# Patient Record
Sex: Male | Born: 1945 | Race: White | Hispanic: No | Marital: Married | Smoking: Former smoker
Health system: Southern US, Community
[De-identification: ages and names within clinical notes are randomized; demographics above are authoritative.]

## PROBLEM LIST (undated history)

## (undated) ENCOUNTER — Emergency Department (HOSPITAL_COMMUNITY): Admission: EM | Discharge status: Absent without leave | Payer: Medicare Other | Source: Home / Self Care

## (undated) DIAGNOSIS — Z87442 Personal history of urinary calculi: Secondary | ICD-10-CM

## (undated) DIAGNOSIS — E876 Hypokalemia: Secondary | ICD-10-CM

## (undated) DIAGNOSIS — G629 Polyneuropathy, unspecified: Secondary | ICD-10-CM

## (undated) DIAGNOSIS — K219 Gastro-esophageal reflux disease without esophagitis: Secondary | ICD-10-CM

## (undated) DIAGNOSIS — E039 Hypothyroidism, unspecified: Secondary | ICD-10-CM

## (undated) DIAGNOSIS — I10 Essential (primary) hypertension: Secondary | ICD-10-CM

## (undated) DIAGNOSIS — I209 Angina pectoris, unspecified: Secondary | ICD-10-CM

## (undated) DIAGNOSIS — R202 Paresthesia of skin: Secondary | ICD-10-CM

## (undated) DIAGNOSIS — I00 Rheumatic fever without heart involvement: Secondary | ICD-10-CM

## (undated) DIAGNOSIS — R7303 Prediabetes: Secondary | ICD-10-CM

## (undated) DIAGNOSIS — N179 Acute kidney failure, unspecified: Secondary | ICD-10-CM

## (undated) DIAGNOSIS — I519 Heart disease, unspecified: Secondary | ICD-10-CM

## (undated) DIAGNOSIS — E785 Hyperlipidemia, unspecified: Secondary | ICD-10-CM

## (undated) DIAGNOSIS — C801 Malignant (primary) neoplasm, unspecified: Secondary | ICD-10-CM

## (undated) DIAGNOSIS — I5189 Other ill-defined heart diseases: Secondary | ICD-10-CM

## (undated) DIAGNOSIS — R0600 Dyspnea, unspecified: Secondary | ICD-10-CM

## (undated) HISTORY — PX: COLONOSCOPY: SHX174

## (undated) HISTORY — PX: PAROTIDECTOMY: SUR1003

## (undated) HISTORY — PX: EYE SURGERY: SHX253

## (undated) HISTORY — PX: THYROIDECTOMY, PARTIAL: SHX18

## (undated) HISTORY — PX: TONSILLECTOMY: SUR1361

## (undated) HISTORY — PX: SPHINCTEROTOMY: SHX5279

## (undated) HISTORY — PX: NASAL POLYP EXCISION: SHX2068

---

## 2001-02-08 ENCOUNTER — Encounter: Admission: RE | Admit: 2001-02-08 | Discharge: 2001-05-09 | Payer: Self-pay | Admitting: *Deleted

## 2005-06-15 ENCOUNTER — Ambulatory Visit: Payer: Self-pay | Admitting: Internal Medicine

## 2005-08-31 ENCOUNTER — Ambulatory Visit: Payer: Self-pay | Admitting: Gastroenterology

## 2005-09-21 ENCOUNTER — Ambulatory Visit: Payer: Self-pay | Admitting: Gastroenterology

## 2006-02-02 ENCOUNTER — Ambulatory Visit: Payer: Self-pay | Admitting: Internal Medicine

## 2011-01-24 ENCOUNTER — Ambulatory Visit: Payer: Self-pay | Admitting: Gastroenterology

## 2011-01-26 LAB — PATHOLOGY REPORT

## 2018-04-12 ENCOUNTER — Other Ambulatory Visit: Payer: Self-pay | Admitting: Nephrology

## 2018-04-12 DIAGNOSIS — E876 Hypokalemia: Secondary | ICD-10-CM

## 2018-04-19 ENCOUNTER — Ambulatory Visit: Payer: Self-pay

## 2018-04-24 ENCOUNTER — Ambulatory Visit
Admission: RE | Admit: 2018-04-24 | Discharge: 2018-04-24 | Disposition: A | Discharge status: Home / Self Care | Payer: Medicare Other | Source: Ambulatory Visit | Attending: Nephrology | Admitting: Nephrology

## 2018-04-24 DIAGNOSIS — N400 Enlarged prostate without lower urinary tract symptoms: Secondary | ICD-10-CM | POA: Insufficient documentation

## 2018-04-24 DIAGNOSIS — N200 Calculus of kidney: Secondary | ICD-10-CM | POA: Insufficient documentation

## 2018-04-24 DIAGNOSIS — E876 Hypokalemia: Secondary | ICD-10-CM | POA: Insufficient documentation

## 2018-06-12 ENCOUNTER — Other Ambulatory Visit: Payer: Self-pay

## 2018-06-12 ENCOUNTER — Encounter (HOSPITAL_COMMUNITY): Payer: Self-pay | Admitting: Emergency Medicine

## 2018-06-12 NOTE — ED Triage Notes (Deleted)
Pt arriving via [facility] from home for intermittent lower back pain that began monday. 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

## 2018-06-12 NOTE — ED Notes (Signed)
Bed: WTR7 Expected date:  Expected time:  Means of arrival:  Comments: 

## 2018-08-21 ENCOUNTER — Ambulatory Visit
Admission: RE | Admit: 2018-08-21 | Discharge: 2018-08-21 | Disposition: A | Discharge status: Home / Self Care | Payer: Medicare Other | Attending: Nephrology | Admitting: Nephrology

## 2018-08-21 ENCOUNTER — Other Ambulatory Visit: Payer: Self-pay | Admitting: Nephrology

## 2018-08-21 ENCOUNTER — Ambulatory Visit
Admission: RE | Admit: 2018-08-21 | Discharge: 2018-08-21 | Disposition: A | Discharge status: Home / Self Care | Payer: Medicare Other | Source: Ambulatory Visit | Attending: Nephrology | Admitting: Nephrology

## 2018-08-21 DIAGNOSIS — N200 Calculus of kidney: Secondary | ICD-10-CM

## 2018-09-17 ENCOUNTER — Other Ambulatory Visit: Payer: Self-pay

## 2018-09-17 ENCOUNTER — Other Ambulatory Visit: Payer: Medicare Other

## 2018-09-17 ENCOUNTER — Encounter
Admission: RE | Admit: 2018-09-17 | Discharge: 2018-09-17 | Disposition: A | Discharge status: Home / Self Care | Payer: Medicare Other | Source: Ambulatory Visit | Attending: Otolaryngology | Admitting: Otolaryngology

## 2018-09-17 HISTORY — DX: Malignant (primary) neoplasm, unspecified: C80.1

## 2018-09-17 HISTORY — DX: Hypothyroidism, unspecified: E03.9

## 2018-09-17 HISTORY — DX: Dyspnea, unspecified: R06.00

## 2018-09-17 HISTORY — DX: Essential (primary) hypertension: I10

## 2018-09-17 HISTORY — DX: Hypokalemia: E87.6

## 2018-09-17 HISTORY — DX: Angina pectoris, unspecified: I20.9

## 2018-09-17 HISTORY — DX: Polyneuropathy, unspecified: G62.9

## 2018-09-17 HISTORY — DX: Gastro-esophageal reflux disease without esophagitis: K21.9

## 2018-09-17 NOTE — Patient Instructions (Signed)
Your procedure is scheduled on:09/19/18 Report to Day Surgery. [facility] To find out your arrival time please call [phone] between 1PM - 3PM on 09/18/18  Remember: Instructions that are not followed completely may result in serious medical risk,  up to and including death, or upon the discretion of your surgeon and anesthesiologist your  surgery may need to be rescheduled.     _X__ 1. Do not eat food after midnight the night before your procedure.                 No gum chewing or hard candies. You may drink clear liquids up to 2 hours                 before you are scheduled to arrive for your surgery- DO not drink clear                 liquids within 2 hours of the start of your surgery.                 Clear Liquids include:  water, apple juice without pulp, clear carbohydrate                 drink such as Clearfast of Gatorade, Black Coffee or Tea (Do not add                 anything to coffee or tea).  __X__2.  On the morning of surgery brush your teeth with toothpaste and water, you                may rinse your mouth with mouthwash if you wish.  Do not swallow any toothpaste of mouthwash.     _X__ 3.  No Alcohol for 24 hours before or after surgery.   _X__ 4.  Do Not Smoke or use e-cigarettes For 24 Hours Prior to Your Surgery.                 Do not use any chewable tobacco products for at least 6 hours prior to                 surgery.  ____  5.  Bring all medications with you on the day of surgery if instructed.   _X___  6.  Notify your doctor if there is any change in your medical condition      (cold, fever, infections).     Do not wear jewelry, make-up, hairpins, clips or nail polish. Do not wear lotions, powders, or perfumes. You may wear deodorant. Do not shave 48 hours prior to surgery. Men may shave face and neck. Do not bring valuables to the hospital.    [facility] is not responsible for any belongings or  valuables.  Contacts, dentures or bridgework may not be worn into surgery. Leave your suitcase in the car. After surgery it may be brought to your room. For patients admitted to the hospital, discharge time is determined by your treatment team.   Patients discharged the day of surgery will not be allowed to drive home.          __X__ Take these medicines the morning of surgery with A SIP OF WATER:    1. AMLODIPINE  2. LEVOTHYROXINE  3.   4.  5.  6.  ____ Fleet Enema (as directed)   ____ Use CHG Soap as directed  ____ Use inhalers on the day of surgery  ____ Stop metformin 2  days prior to surgery    ____ Take 1/2 of usual insulin dose the night before surgery. No insulin the morning          of surgery.   X___ Stop Coumadin/Plavix/aspirin on   STOPPED  _X__ Stop Anti-inflammatories on   STOPPED   __X__ Stop supplements until after surgery.   NO FISH OIL UNTIL AFTER SURGERY  ____ Bring C-Pap to the hospital.

## 2018-09-19 ENCOUNTER — Other Ambulatory Visit: Payer: Self-pay

## 2018-09-19 ENCOUNTER — Ambulatory Visit: Payer: Medicare Other | Admitting: Anesthesiology

## 2018-09-19 ENCOUNTER — Encounter: Payer: Self-pay | Admitting: *Deleted

## 2018-09-19 ENCOUNTER — Encounter
Admission: RE | Disposition: A | Discharge status: Home / Self Care | Payer: Self-pay | Source: Home / Self Care | Attending: Otolaryngology

## 2018-09-19 ENCOUNTER — Ambulatory Visit
Admission: RE | Admit: 2018-09-19 | Discharge: 2018-09-19 | Disposition: A | Discharge status: Home / Self Care | Payer: Medicare Other | Attending: Otolaryngology | Admitting: Otolaryngology

## 2018-09-19 DIAGNOSIS — I10 Essential (primary) hypertension: Secondary | ICD-10-CM | POA: Diagnosis not present

## 2018-09-19 DIAGNOSIS — E039 Hypothyroidism, unspecified: Secondary | ICD-10-CM | POA: Diagnosis not present

## 2018-09-19 DIAGNOSIS — H6523 Chronic serous otitis media, bilateral: Secondary | ICD-10-CM | POA: Insufficient documentation

## 2018-09-19 DIAGNOSIS — H6980 Other specified disorders of Eustachian tube, unspecified ear: Secondary | ICD-10-CM | POA: Insufficient documentation

## 2018-09-19 DIAGNOSIS — E876 Hypokalemia: Secondary | ICD-10-CM | POA: Insufficient documentation

## 2018-09-19 DIAGNOSIS — G629 Polyneuropathy, unspecified: Secondary | ICD-10-CM | POA: Diagnosis not present

## 2018-09-19 DIAGNOSIS — K219 Gastro-esophageal reflux disease without esophagitis: Secondary | ICD-10-CM | POA: Insufficient documentation

## 2018-09-19 HISTORY — PX: MYRINGOTOMY WITH TUBE PLACEMENT: SHX5663

## 2018-09-19 SURGERY — MYRINGOTOMY WITH TUBE PLACEMENT
Anesthesia: General | Site: Ear | Laterality: Bilateral

## 2018-09-19 MED ORDER — FAMOTIDINE 20 MG PO TABS
ORAL_TABLET | ORAL | Status: AC
  Filled 2018-09-19: qty 1

## 2018-09-19 MED ORDER — ONDANSETRON HCL 4 MG PO TABS: 4.0000 mg | ORAL_TABLET | Freq: Three times a day (TID) | ORAL | 0 refills | Status: DC | PRN

## 2018-09-19 MED ORDER — FENTANYL CITRATE (PF) 100 MCG/2ML IJ SOLN
INTRAMUSCULAR | Status: AC
  Filled 2018-09-19: qty 2

## 2018-09-19 MED ORDER — LACTATED RINGERS IV SOLN
INTRAVENOUS | Status: DC
  Administered 2018-09-19: 07:00:00 via INTRAVENOUS

## 2018-09-19 MED ORDER — MIDAZOLAM HCL 2 MG/2ML IJ SOLN
INTRAMUSCULAR | Status: AC
  Filled 2018-09-19: qty 2

## 2018-09-19 MED ORDER — FAMOTIDINE 20 MG PO TABS
20.0000 mg | ORAL_TABLET | Freq: Once | ORAL | Status: AC
  Administered 2018-09-19: 20 mg via ORAL

## 2018-09-19 SURGICAL SUPPLY — 10 items
BLADE MYR LANCE NRW W/HDL (BLADE) ×3 IMPLANT
CANISTER SUCT 1200ML W/VALVE (MISCELLANEOUS) ×3 IMPLANT
COTTON BALL STRL MEDIUM (GAUZE/BANDAGES/DRESSINGS) ×3 IMPLANT
COVER WAND RF STERILE (DRAPES) ×3 IMPLANT
GLOVE BIOGEL M STRL SZ7.5 (GLOVE) ×3 IMPLANT
TOWEL OR 17X26 4PK STRL BLUE (TOWEL DISPOSABLE) ×3 IMPLANT
TUBE EAR ARMSTRONG HC 1.14X3.5 (OTOLOGIC RELATED) IMPLANT
TUBE EAR T 1.27X5.3 BFLY (OTOLOGIC RELATED) ×6 IMPLANT
TUBING CONNECTING 10 (TUBING) ×2 IMPLANT
TUBING CONNECTING 10' (TUBING) ×1

## 2018-09-19 NOTE — Anesthesia Preprocedure Evaluation (Signed)
Anesthesia Evaluation  Patient identified by MRN, date of birth, ID band Patient awake    Reviewed: Allergy & Precautions, NPO status , Patient's Chart, lab work & pertinent test results  History of Anesthesia Complications Negative for: history of anesthetic complications  Airway Mallampati: II  TM Distance: >3 FB Neck ROM: Full    Dental no notable dental hx.    Pulmonary neg sleep apnea, neg COPD, former smoker,    breath sounds clear to auscultation- rhonchi (-) wheezing      Cardiovascular hypertension, Pt. on medications (-) CAD, (-) Past MI, (-) Cardiac Stents and (-) CABG  Rhythm:Regular Rate:Normal - Systolic murmurs and - Diastolic murmurs NM stress test 07/25/18: Normal myocardial perfusion scan no evidence of stress-induced  myocardial-ischemia ejection fraction of 52% conclusion negative scan   Neuro/Psych neg Seizures negative neurological ROS  negative psych ROS   GI/Hepatic Neg liver ROS, GERD  ,  Endo/Other  neg diabetesHypothyroidism   Renal/GU negative Renal ROS     Musculoskeletal negative musculoskeletal ROS (+)   Abdominal (+) - obese,   Peds  Hematology negative hematology ROS (+)   Anesthesia Other Findings Past Medical History: No date: Anginal pain ([place])     Comment:  AT REST. STRESS/ECHO 07/25/18 No date: [facility])     Comment:  SKIN No date: Chronic hypokalemia     Comment:  SEEING DR [facility] No date: Dyspnea     Comment:  DOE No date: GERD (gastroesophageal reflux disease) No date: Hypertension No date: Hypothyroidism No date: Neuropathy     Comment:  FEET   Reproductive/Obstetrics                             Anesthesia Physical Anesthesia Plan  ASA: III  Anesthesia Plan: General   Post-op Pain Management:    Induction: Intravenous  PONV Risk Score and Plan: 1 and Ondansetron and Midazolam  Airway Management Planned: LMA  Additional  Equipment:   Intra-op Plan:   Post-operative Plan:   Informed Consent: I have reviewed the patients History and Physical, chart, labs and discussed the procedure including the risks, benefits and alternatives for the proposed anesthesia with the patient or authorized representative who has indicated his/her understanding and acceptance.     Dental advisory given  Plan Discussed with: CRNA and Anesthesiologist  Anesthesia Plan Comments:         Anesthesia Quick Evaluation

## 2018-09-19 NOTE — Anesthesia Postprocedure Evaluation (Signed)
Anesthesia Post Note  Patient: Daden Mahany Mcgrady  Procedure(s) Performed: MYRINGOTOMY WITH TUBE PLACEMENT (Bilateral Ear)  Patient location during evaluation: PACU Anesthesia Type: General Level of consciousness: awake and alert and oriented Pain management: pain level controlled Vital Signs Assessment: post-procedure vital signs reviewed and stable Respiratory status: spontaneous breathing, nonlabored ventilation and respiratory function stable Cardiovascular status: blood pressure returned to baseline and stable Postop Assessment: no signs of nausea or vomiting Anesthetic complications: no     Last Vitals:  Vitals:   09/19/18 0716 09/19/18 1017  BP: 125/80 129/69  Pulse: 73 60  Resp: 16 16  Temp: 36.5 C 36.7 C  SpO2: 99% 99%    Last Pain:  Vitals:   09/19/18 1017  TempSrc: Oral  PainSc: 0-No pain                 Ravon Mcilhenny

## 2018-09-19 NOTE — Op Note (Signed)
..  09/19/2018  9:03 AM    Fabrizio, Quillian Quince  [id number]   Pre-Op Dx:  Chronic Otitis Media Eustachian Tube Dysfunction  Post-op Dx: Chronic Otitis Media Eustachian Tube Dysfunction  Proc:Bilateral myringotomy with tubes  Surg: Tyler Young  Anes:  General by mask  EBL:  None  Comp:  None  Findings:  Bilateral serous otitis media.  Butterfly tubes placed posterior inferiorly  Procedure: With the patient in a comfortable supine position, general mask anesthesia was administered.  At an appropriate level, microscope and speculum were used to examine and clean the RIGHT ear canal.  The findings were as described above.  An posterior inferior radial myringotomy incision was sharply executed.  Middle ear contents were suctioned clear with a size 5 otologic suction.  A PE tube was placed without difficulty using a Rosen pick and Animal nutritionist.  Ciprodex otic solution was instilled into the external canal, and insufflated into the middle ear.  A cotton ball was placed at the external meatus. Hemostasis was observed.  This side was completed.  After completing the RIGHT side, the LEFT side was done in identical fashion.    Following this  The patient was returned to anesthesia, awakened, and transferred to recovery in stable condition.  Dispo:  PACU to home  Plan: Routine drop use and water precautions.  Recheck my office three weeks.   Jeannie Fend Kym Scannell 9:03 AM 09/19/2018

## 2018-09-19 NOTE — H&P (Signed)
..  History and Physical paper copy reviewed and updated date of procedure and will be scanned into system.  Patient seen and examined.  

## 2018-09-19 NOTE — Transfer of Care (Signed)
Immediate Anesthesia Transfer of Care Note  Patient: Tyler Young  Procedure(s) Performed: MYRINGOTOMY WITH TUBE PLACEMENT (Bilateral Ear)  Patient Location: PACU  Anesthesia Type:General  Level of Consciousness: awake and alert   Airway & Oxygen Therapy: Patient Spontanous Breathing and Patient connected to face mask oxygen  Post-op Assessment: Report given to RN and Post -op Vital signs reviewed and stable  Post vital signs: Reviewed and stable  Last Vitals:  Vitals Value Taken Time  BP    Temp    Pulse    Resp    SpO2      Last Pain:  Vitals:   09/19/18 1017  TempSrc: Oral  PainSc: 0-No pain         Complications: No apparent anesthesia complications

## 2018-09-20 ENCOUNTER — Encounter: Payer: Self-pay | Admitting: Otolaryngology

## 2019-06-03 ENCOUNTER — Other Ambulatory Visit: Payer: Self-pay | Admitting: Internal Medicine

## 2019-06-03 DIAGNOSIS — N200 Calculus of kidney: Secondary | ICD-10-CM

## 2019-06-13 ENCOUNTER — Other Ambulatory Visit: Payer: Self-pay

## 2019-06-13 ENCOUNTER — Ambulatory Visit
Admission: RE | Admit: 2019-06-13 | Discharge: 2019-06-13 | Disposition: A | Discharge status: Home / Self Care | Payer: Medicare Other | Source: Ambulatory Visit | Attending: Internal Medicine | Admitting: Internal Medicine

## 2019-06-13 DIAGNOSIS — N200 Calculus of kidney: Secondary | ICD-10-CM | POA: Diagnosis present

## 2019-12-24 ENCOUNTER — Other Ambulatory Visit: Payer: Self-pay

## 2019-12-24 ENCOUNTER — Ambulatory Visit: Payer: Medicare Other | Admitting: Urology

## 2019-12-24 ENCOUNTER — Ambulatory Visit
Admission: RE | Admit: 2019-12-24 | Discharge: 2019-12-24 | Disposition: A | Discharge status: Home / Self Care | Payer: Medicare Other | Source: Ambulatory Visit | Attending: Urology | Admitting: Urology

## 2019-12-24 VITALS — BP 161/95 | HR 90 | Ht 72.0 in | Wt 216.0 lb

## 2019-12-24 DIAGNOSIS — Z87442 Personal history of urinary calculi: Secondary | ICD-10-CM

## 2019-12-24 MED ORDER — TAMSULOSIN HCL 0.4 MG PO CAPS: 0.4000 mg | ORAL_CAPSULE | Freq: Every day | ORAL | 0 refills | Status: AC

## 2019-12-24 NOTE — Progress Notes (Signed)
12/24/19 1:54 PM   [place] 07/18/1945 [id number]  Referring provider: Tracie Harrier, MD [address] [facility] [place],  [place] [address] Chief Complaint  Patient presents with  . painful urination    HPI: Tyler Young is a 74 y.o. M who presents today for the evaluation and management of renal calculi.   RUS from 06/14/19 revealed a single 7 mm nonobstructive stone within the interpolar right Kidney. No hydronephrosis or obstructive uropathy.  He was seen by urgent care for a possible UTI on 12/20/19. He was experiencing symptoms of burning w/ urination and discomfort in his right groin.  UA from 12/20/19 revealed 10-50 RBC w/ associated culture negative. He was treated w/ Cipro for UTI (not true infection).   Today, he states of no discomfort and does not know if he passed his stone.   He has not seen a urologist before.   He is a former smoker.   PMH: Past Medical History:  Diagnosis Date  . Anginal pain ([place])    AT REST. STRESS/ECHO 07/25/18  . Cancer ([place])    SKIN  . Chronic hypokalemia    SEEING DR [facility]  . Dyspnea    DOE  . GERD (gastroesophageal reflux disease)   . Hypertension   . Hypothyroidism   . Neuropathy    FEET    Surgical History: Past Surgical History:  Procedure Laterality Date  . COLONOSCOPY    . EYE SURGERY    . MYRINGOTOMY WITH TUBE PLACEMENT Bilateral 09/19/2018   Procedure: MYRINGOTOMY WITH TUBE PLACEMENT;  Surgeon: Carloyn Manner, MD;  Location: [facility] ORS;  Service: ENT;  Laterality: Bilateral;  . NASAL POLYP EXCISION    . PAROTIDECTOMY Right   . THYROIDECTOMY, PARTIAL    . TONSILLECTOMY      Home Medications:  Allergies as of 12/24/2019      Reactions   Doxycycline Other (See Comments)   Unsure of exact reaction type   Penicillins Other (See Comments)   Did it involve swelling of the face/tongue/throat, SOB, or low BP? Unknown Did it involve sudden or severe rash/hives, skin peeling, or any  reaction on the inside of your mouth or nose? Unknown Did you need to seek medical attention at a hospital or doctor's office? Yes When did it last happen? Childhood reaction at 74 years old If all above answers are "NO", may proceed with cephalosporin use.      Medication List       Accurate as of December 24, 2019 11:59 PM. If you have any questions, ask your nurse or doctor.        STOP taking these medications   cefdinir 300 MG capsule Commonly known as: OMNICEF Stopped by: Hollice Espy, MD   naproxen sodium 220 MG tablet Commonly known as: ALEVE Stopped by: Hollice Espy, MD   ondansetron 4 MG tablet Commonly known as: Zofran Stopped by: Hollice Espy, MD     TAKE these medications   aMILoride 5 MG tablet Commonly known as: MIDAMOR Take 10 mg by mouth daily.   amLODipine 5 MG tablet Commonly known as: NORVASC Take 5 mg by mouth daily. What changed: Another medication with the same name was removed. Continue taking this medication, and follow the directions you see here. Changed by: Hollice Espy, MD   aspirin EC 81 MG tablet Take 81 mg by mouth every evening.   ciprofloxacin 250 MG tablet Commonly known as: CIPRO Take by mouth.   doxazosin 2 MG  tablet Commonly known as: CARDURA Take 2 mg by mouth every evening.   gabapentin 100 MG capsule Commonly known as: NEURONTIN Take 100 mg by mouth at bedtime.   levothyroxine 125 MCG tablet Commonly known as: SYNTHROID Take 125 mcg by mouth daily before breakfast.   MEGARED OMEGA-3 KRILL OIL PO Take 1 capsule by mouth 2 (two) times daily.   olmesartan 20 MG tablet Commonly known as: BENICAR Take 20 mg by mouth every evening.   potassium chloride SA 20 MEQ tablet Commonly known as: KLOR-CON Take by mouth.   pravastatin 20 MG tablet Commonly known as: PRAVACHOL Take 20 mg by mouth every evening.   tamsulosin 0.4 MG Caps capsule Commonly known as: Flomax Take 1 capsule (0.4 mg total) by mouth  daily for 14 days. Started by: Hollice Espy, MD   Vascepa 0.5 g Caps Generic drug: Icosapent Ethyl Take 1 capsule by mouth daily.   vitamin B-12 1000 MCG tablet Commonly known as: CYANOCOBALAMIN Take 1,000 mcg by mouth daily.       Allergies:  Allergies  Allergen Reactions  . Doxycycline Other (See Comments)    Unsure of exact reaction type  . Penicillins Other (See Comments)    Did it involve swelling of the face/tongue/throat, SOB, or low BP? Unknown Did it involve sudden or severe rash/hives, skin peeling, or any reaction on the inside of your mouth or nose? Unknown Did you need to seek medical attention at a hospital or doctor's office? Yes When did it last happen? Childhood reaction at 74 years old If all above answers are "NO", may proceed with cephalosporin use.     Family History: No family history on file.  Social History:  reports that he quit smoking about 30 years ago. He has never used smokeless tobacco. He reports current alcohol use. He reports previous drug use.   Physical Exam: BP (!) 161/95   Pulse 90   Ht 6' (1.829 m)   Wt 216 lb (98 kg)   BMI 29.29 kg/m   Constitutional:  Alert and oriented, No acute distress. HEENT: [place] AT, moist mucus membranes.  Trachea midline, no masses. Cardiovascular: No clubbing, cyanosis, or edema. Respiratory: Normal respiratory effort, no increased work of breathing. Skin: No rashes, bruises or suspicious lesions. Neurologic: Grossly intact, no focal deficits, moving all 4 extremities. Psychiatric: Normal mood and affect.  Laboratory Data:  Urinalysis 3-10 RBC/hpf   Pertinent Imaging: CLINICAL DATA:  Initial evaluation for kidney stone.  EXAM: RENAL / URINARY TRACT ULTRASOUND COMPLETE  COMPARISON:  Prior ultrasound from 04/24/2018.  FINDINGS: Right Kidney:  Renal measurements: 13.5 x 5.0 x 6.2 cm = volume: 219 mL. Echogenicity within normal limits. No mass or hydronephrosis visualized. 7 mm  shadowing nonobstructive stone present within the interpolar region.  Left Kidney:  Renal measurements: 12.4 x 4.2 x 5.3 cm = volume: 144 mL. Echogenicity within normal limits. No mass or hydronephrosis visualized. No sonographic evidence for nephrolithiasis.  Bladder:  Appears normal for degree of bladder distention.  Other:  None.  IMPRESSION: 1. Single 7 mm nonobstructive stone within the interpolar right kidney. 2. No hydronephrosis or obstructive uropathy.   Electronically Signed   By: Jeannine Boga M.D.   On: 06/14/2019 07:47  I have personally reviewed the images and agree with radiologist interpretation.  Assessment & Plan:    1. Microscopic hematuria  3-10 RBC/hpf Possibly related to acute stone event? Return in 1 month for UA recheck prior to considering more extensive evaluation May  consider a hematuria workup if UA reveals microscopic blood in 1 month   2. Nephrolithiasis  RUS from 06/14/19 revealed a single 7 mm nonobstructive stone KUB today to rule out stone passage  Encouraged increased water intake  Rx of Flomax sent to pharmacy  Warning symptoms reviewed  4 weeks f/u with [facility] [address] [place], [place] [address] [phone]  I, Lucas Mallow, am acting as a scribe for Dr. Hollice Espy,  I have reviewed the above documentation for accuracy and completeness, and I agree with the above.   Hollice Espy, MD  I spent 45 min with this patient of which greater than 50% was spent in counseling and coordination of care with the patient.

## 2019-12-29 LAB — URINALYSIS, COMPLETE
Bilirubin, UA: NEGATIVE
Leukocytes,UA: NEGATIVE
Nitrite, UA: NEGATIVE
Protein,UA: NEGATIVE
Specific Gravity, UA: 1.025 (ref 1.005–1.030)
Urobilinogen, Ur: 0.2 mg/dL (ref 0.2–1.0)
pH, UA: 6 (ref 5.0–7.5)

## 2019-12-29 LAB — MICROSCOPIC EXAMINATION: Bacteria, UA: NONE SEEN

## 2020-01-08 ENCOUNTER — Telehealth: Payer: Self-pay | Admitting: *Deleted

## 2020-01-08 MED ORDER — TAMSULOSIN HCL 0.4 MG PO CAPS: 0.4000 mg | ORAL_CAPSULE | Freq: Every day | ORAL | 1 refills | Status: DC

## 2020-01-08 NOTE — Telephone Encounter (Signed)
Informed patient regarding stent removal, doing well. Sent refill of Flomax to [facility]. Aware will call next week to schedule stent removal. Voiced understanding.

## 2020-01-14 ENCOUNTER — Telehealth: Payer: Self-pay | Admitting: Urology

## 2020-01-14 NOTE — Telephone Encounter (Signed)
Reviewed records from [facility].  Okay to schedule for stent removal with me.  Per patient my chart message, he partial first this done after 7/12.  Hollice Espy, MD

## 2020-01-21 ENCOUNTER — Ambulatory Visit: Payer: Self-pay | Admitting: Physician Assistant

## 2020-01-27 NOTE — Progress Notes (Signed)
   01/28/2020  CC:  Chief Complaint  Patient presents with  . Cysto Stent Removal    HPI: Tyler Young is a 74 y.o. male with a history of kidney stones presents today for a stent removal.   He was out of town traveling when he had an acute stone episode.  Ultimately was taken to the operating on 6 09/08/2018 for treatment of her right obstructing ureteral calculus.  He presents today for cystoscopy, stent removal.  Chart records from [facility] in [place] were reviewed extensively today.  He notes that he has noticed a curvature in his penis.   He is concerned about his blood pressure which is managed by his PCP.    Blood pressure (!) 143/81, pulse 89.  NED. A&Ox3.   No respiratory distress   Abd soft, NT, ND Normal phallus with bilateral descended testicles  Cystoscopy/ Stent removal procedure  Patient identification was confirmed, informed consent was obtained, and patient was prepped using Betadine solution.  Lidocaine jelly was administered per urethral meatus.    Preoperative abx where received prior to procedure.    Procedure: - Flexible cystoscope introduced, without any difficulty.   - Thorough search of the bladder revealed:    normal urethral meatus  Stent seen emanating from  ureteral orifice, grasped with stent graspers, and removed in entirety.     Post-Procedure: - Patient tolerated the procedure well  Assessment/ Plan:  1. Right ureteral stone Status post ureteroscopy in [place], records reviewed  Stent removed today without difficulty  Warning symptoms reviewed, prophylactic Bactrim given  Plan follow-up in 4 weeks with renal ultrasound    He also mentions that he is concerned today about penile curvature.  We will plan to discuss this upon his return.   Fransico Him, am acting as a scribe for Dr. Hollice Espy.  I have reviewed the above documentation for accuracy and completeness, and I agree with the  above.   Hollice Espy, MD

## 2020-01-28 ENCOUNTER — Other Ambulatory Visit: Payer: Self-pay

## 2020-01-28 ENCOUNTER — Ambulatory Visit (INDEPENDENT_AMBULATORY_CARE_PROVIDER_SITE_OTHER): Payer: Medicare Other | Admitting: Urology

## 2020-01-28 VITALS — BP 143/81 | HR 89

## 2020-01-28 DIAGNOSIS — Z87442 Personal history of urinary calculi: Secondary | ICD-10-CM | POA: Diagnosis not present

## 2020-01-28 MED ORDER — SULFAMETHOXAZOLE-TRIMETHOPRIM 800-160 MG PO TABS
1.0000 | ORAL_TABLET | Freq: Two times a day (BID) | ORAL | Status: DC
  Administered 2020-01-28: 1 via ORAL

## 2020-01-29 LAB — URINALYSIS, COMPLETE
Bilirubin, UA: NEGATIVE
Glucose, UA: NEGATIVE
Nitrite, UA: NEGATIVE
Specific Gravity, UA: 1.02 (ref 1.005–1.030)
Urobilinogen, Ur: 1 mg/dL (ref 0.2–1.0)
pH, UA: 8.5 — ABNORMAL HIGH (ref 5.0–7.5)

## 2020-01-29 LAB — MICROSCOPIC EXAMINATION: RBC, Urine: >30 /hpf — AB (ref 0–2)

## 2020-02-11 ENCOUNTER — Other Ambulatory Visit: Payer: Self-pay | Admitting: Physician Assistant

## 2020-02-25 ENCOUNTER — Other Ambulatory Visit: Payer: Self-pay

## 2020-02-25 ENCOUNTER — Ambulatory Visit
Admission: RE | Admit: 2020-02-25 | Discharge: 2020-02-25 | Disposition: A | Discharge status: Home / Self Care | Payer: Medicare Other | Source: Ambulatory Visit | Attending: Urology | Admitting: Urology

## 2020-02-25 DIAGNOSIS — Z87442 Personal history of urinary calculi: Secondary | ICD-10-CM | POA: Insufficient documentation

## 2020-02-25 DIAGNOSIS — N400 Enlarged prostate without lower urinary tract symptoms: Secondary | ICD-10-CM | POA: Insufficient documentation

## 2020-02-25 DIAGNOSIS — N200 Calculus of kidney: Secondary | ICD-10-CM | POA: Insufficient documentation

## 2020-03-01 NOTE — Progress Notes (Signed)
03/02/2020 10:38 PM   Tyler Young [birth year] [id number]  Referring provider: Tracie Harrier, MD [address] [facility] [place],  [place] [address] Chief Complaint  Patient presents with  . history of kidney stones    HPI: Tyler Young is a 74 y.o. male who returns today for a 1 month follow up of right ureteral stone.   He was out of town traveling when he had an acute stone episode.  Ultimately was taken to the operating on 6 09/08/2018 for treatment of her right obstructing ureteral calculus. Chart records from [facility] in [place] were reviewed extensively today.    Her underwent stent removal on 01/28/2020.  RUS on 02/26/2020 revealed a 6 mm left renal calculus. Normal renal cortical thickness without renal lesions or hydronephrosis. Mild prostate gland enlargement.  Today the patient is doing well.  No flank pain.  During the last visit, he noted that he had noticed a curvature in his penis. Ultimately ins is not very sexually active and low bother.  Curve has been stable.  No pain.  PMH: Past Medical History:  Diagnosis Date  . Anginal pain ([place])    AT REST. STRESS/ECHO 07/25/18  . Cancer ([place])    SKIN  . Chronic hypokalemia    SEEING DR [facility]  . Dyspnea    DOE  . GERD (gastroesophageal reflux disease)   . Hypertension   . Hypothyroidism   . Neuropathy    FEET    Surgical History: Past Surgical History:  Procedure Laterality Date  . COLONOSCOPY    . EYE SURGERY    . MYRINGOTOMY WITH TUBE PLACEMENT Bilateral 09/19/2018   Procedure: MYRINGOTOMY WITH TUBE PLACEMENT;  Surgeon: Carloyn Manner, MD;  Location: [facility] ORS;  Service: ENT;  Laterality: Bilateral;  . NASAL POLYP EXCISION    . PAROTIDECTOMY Right   . THYROIDECTOMY, PARTIAL    . TONSILLECTOMY      Home Medications:  Allergies as of 03/02/2020      Reactions   Doxycycline Other (See Comments)   Unsure of exact reaction type   Penicillins Other (See  Comments)   Did it involve swelling of the face/tongue/throat, SOB, or low BP? Unknown Did it involve sudden or severe rash/hives, skin peeling, or any reaction on the inside of your mouth or nose? Unknown Did you need to seek medical attention at a hospital or doctor's office? Yes When did it last happen? Childhood reaction at 74 years old If all above answers are "NO", may proceed with cephalosporin use.      Medication List       Accurate as of March 02, 2020 10:38 PM. If you have any questions, ask your nurse or doctor.        aMILoride 5 MG tablet Commonly known as: MIDAMOR Take 10 mg by mouth daily.   amLODipine 5 MG tablet Commonly known as: NORVASC Take 5 mg by mouth daily.   aspirin EC 81 MG tablet Take 81 mg by mouth every evening.   doxazosin 2 MG tablet Commonly known as: CARDURA Take 2 mg by mouth every evening.   gabapentin 100 MG capsule Commonly known as: NEURONTIN Take 100 mg by mouth at bedtime.   levothyroxine 125 MCG tablet Commonly known as: SYNTHROID Take 125 mcg by mouth daily before breakfast.   MEGARED OMEGA-3 KRILL OIL PO Take 1 capsule by mouth 2 (two) times daily.   olmesartan 20 MG tablet Commonly known as: [employer]  Take 20 mg by mouth every evening.   potassium chloride SA 20 MEQ tablet Commonly known as: KLOR-CON Take by mouth.   pravastatin 20 MG tablet Commonly known as: PRAVACHOL Take 20 mg by mouth every evening.   tamsulosin 0.4 MG Caps capsule Commonly known as: FLOMAX TAKE 1 CAPSULE BY MOUTH EVERY DAY   Vascepa 0.5 g Caps Generic drug: Icosapent Ethyl Take 1 capsule by mouth daily.   vitamin B-12 1000 MCG tablet Commonly known as: CYANOCOBALAMIN Take 1,000 mcg by mouth daily.       Allergies:  Allergies  Allergen Reactions  . Doxycycline Other (See Comments)    Unsure of exact reaction type  . Penicillins Other (See Comments)    Did it involve swelling of the face/tongue/throat, SOB, or low BP?  Unknown Did it involve sudden or severe rash/hives, skin peeling, or any reaction on the inside of your mouth or nose? Unknown Did you need to seek medical attention at a hospital or doctor's office? Yes When did it last happen? Childhood reaction at 74 years old If all above answers are "NO", may proceed with cephalosporin use.     Family History: No family history on file.  Social History:  reports that he quit smoking about 30 years ago. He has never used smokeless tobacco. He reports current alcohol use. He reports previous drug use.   Physical Exam: BP 123/68   Pulse 81   Constitutional:  Alert and oriented, No acute distress. HEENT: [place] AT, moist mucus membranes.  Trachea midline, no masses. Cardiovascular: No clubbing, cyanosis, or edema. Respiratory: Normal respiratory effort, no increased work of breathing. Skin: No rashes, bruises or suspicious lesions. Neurologic: Grossly intact, no focal deficits, moving all 4 extremities. Psychiatric: Normal mood and affect.   Urinalysis No microscopic hematuria   Pertinent Imaging: Results for orders placed during the hospital encounter of 02/25/20  Ultrasound renal complete  Narrative CLINICAL DATA:  Renal calculi.  EXAM: RENAL / URINARY TRACT ULTRASOUND COMPLETE  COMPARISON:  Renal ultrasound 06/13/2019  FINDINGS: Right Kidney:  Renal measurements: 12.6 x 5.0 x 6.0 cm = volume: 198.3 mL. Normal renal cortical thickness and echogenicity without focal lesions or hydronephrosis.  Left Kidney:  Renal measurements: 11.9 x 5.2 x 4.8 cm = volume: 155.0 mL. Normal renal cortical thickness and echogenicity for age. No worrisome renal lesions or hydronephrosis. There is a 6 mm midpole calculus noted.  Bladder:  Relatively decompressed with mild apparent wall thickening. No bladder mass or calculi.  Other:  Mild prostate gland enlargement.  IMPRESSION: 1. 6 mm left renal calculus. 2. Normal renal cortical  thickness without renal lesions or hydronephrosis. 3. Mild prostate gland enlargement.   Electronically Signed By: Marijo Sanes M.D. On: 02/26/2020 12:09   I have personally reviewed the images and agree with radiologist interpretation.    Assessment & Plan:    1. Right ureteral stone   RUS without hydronephrosis  Probable left punctate non obstructing stone  We discussed general stone prevention techniques including drinking plenty water with goal of producing 2.5 L urine daily, increased citric acid intake, avoidance of high oxalate containing foods, and decreased salt intake.  Information about dietary recommendations given today.   Follow up in 1 year with [facility].   2. Peyronie's Disease  Based on patient's history of physical exam, findings are consistent with Peyronie's disease.   Treatment options and goals of treatment were discussed today in detail. Options including observation, penile plaque and graft, penile plication,  placement of penile prosthesis, and injection of collagenase were all reviewed.  An benefits of each were discussed at length.  He does not want to pursue any intervention at this ti me.   3. Microscopic hematuria  UA was drawn earlier by PCP office. No microscopic hematuria.   Fu 1 year with [facility] [address] [place], [place] [address] [phone]  I, Selena Batten, am acting as a scribe for Dr. Hollice Espy.  I have reviewed the above documentation for accuracy and completeness, and I agree with the above.   Hollice Espy, MD

## 2020-03-02 ENCOUNTER — Other Ambulatory Visit: Payer: Self-pay

## 2020-03-02 ENCOUNTER — Encounter: Payer: Self-pay | Admitting: Urology

## 2020-03-02 ENCOUNTER — Ambulatory Visit: Payer: Medicare Other | Admitting: Urology

## 2020-03-02 VITALS — BP 123/68 | HR 81

## 2020-03-02 DIAGNOSIS — Z87442 Personal history of urinary calculi: Secondary | ICD-10-CM

## 2020-03-02 DIAGNOSIS — R3129 Other microscopic hematuria: Secondary | ICD-10-CM | POA: Diagnosis not present

## 2020-07-21 ENCOUNTER — Other Ambulatory Visit
Admission: RE | Admit: 2020-07-21 | Discharge: 2020-07-21 | Disposition: A | Discharge status: Home / Self Care | Payer: Medicare Other | Source: Ambulatory Visit | Attending: General Surgery | Admitting: General Surgery

## 2020-07-21 ENCOUNTER — Ambulatory Visit: Payer: Self-pay | Admitting: General Surgery

## 2020-07-21 ENCOUNTER — Other Ambulatory Visit: Payer: Self-pay

## 2020-07-21 HISTORY — DX: Prediabetes: R73.03

## 2020-07-21 HISTORY — DX: Personal history of urinary calculi: Z87.442

## 2020-07-21 NOTE — Patient Instructions (Signed)
Your procedure is scheduled on: Monday July 26, 2020. Report to Day Surgery inside [facility] 2nd floor (stop by Admissions desk on first floor first). To find out your arrival time please call [phone] between 1PM - 3PM on Friday July 23, 2020.  Remember: Instructions that are not followed completely may result in serious medical risk,  up to and including death, or upon the discretion of your surgeon and anesthesiologist your  surgery may need to be rescheduled.     _X__ 1. Do not eat food after midnight the night before your procedure.                 No chewing gum or hard candies. You may drink clear liquids up to 2 hours                 before you are scheduled to arrive for your surgery- DO not drink clear                 liquids within 2 hours of the start of your surgery.                 Clear Liquids include:  water, apple juice without pulp, clear Gatorade, G2 or                  Gatorade Zero (avoid Red/Purple/Blue), Black Coffee or Tea (Do not add                 anything to coffee or tea).  __X__2.  On the morning of surgery brush your teeth with toothpaste and water, you                may rinse your mouth with mouthwash if you wish.  Do not swallow any toothpaste of mouthwash.     _X__ 3.  No Alcohol for 24 hours before or after surgery.   _X__ 4.  Do Not Smoke or use e-cigarettes For 24 Hours Prior to Your Surgery.                 Do not use any chewable tobacco products for at least 6 hours prior to                 Surgery.  _X__  5.  Do not use any recreational drugs (marijuana, cocaine, heroin, ecstasy, MDMA or other)                For at least one week prior to your surgery.  Combination of these drugs with anesthesia                May have life threatening results.  __X__ 6.  Notify your doctor if there is any change in your medical condition      (cold, fever, infections).     Do not wear jewelry, make-up, hairpins, clips  or nail polish. Do not wear lotions, powders, or perfumes. You may wear deodorant. Do not shave 48 hours prior to surgery. Men may shave face and neck. Do not bring valuables to the hospital.    [facility] is not responsible for any belongings or valuables.  Contacts, dentures or bridgework may not be worn into surgery. Leave your suitcase in the car. After surgery it may be brought to your room. For patients admitted to the hospital, discharge time is determined by your treatment team.   Patients discharged the day of surgery will not be allowed to drive home.  Make arrangements for someone to be with you for the first 24 hours of your Same Day Discharge.   __X__ Take these medicines the morning of surgery with A SIP OF WATER:    1. levothyroxine (SYNTHROID, LEVOTHROID) 125 MCG  2. amLODipine (NORVASC) 5 MG    ____ Fleet Enema (as directed)   __X__ Use CHG Soap (or wipes) as directed  ____ Use Benzoyl Peroxide Gel as instructed  __X__ Use inhalers on the day of surgery   triamcinolone (NASACORT) 55 MCG/ACT AERO nasal inhaler  ____ Stop metformin 2 days prior to surgery    ____ Take 1/2 of usual insulin dose the night before surgery. No insulin the morning          of surgery.   ____ Stop Coumadin/Plavix/aspirin  __X__ Stop Anti-inflammatories such as naproxen sodium (ALEVE), Ibuprofen, Advil, aspirin and or BC powders.     __X__ Stop supplements until after surgery.    __X__ Do not start any herbal supplements before your procedure.    If you have any questions regarding your pre-procedure instructions,  Please call Pre-admit Testing at [phone].

## 2020-07-21 NOTE — H&P (Signed)
HISTORY OF PRESENT ILLNESS:    Tyler Young is a 75 y.o.male patient who comes for follow up of perianal fissure.  Patient reports that he continue having per in rectum. Pain has improve since first visit but continue to be persistent. The patient reports that pain in on perianal area. Pain exacerbated by bowel movement. Pain improve with nifedipine/lidocaine cream. Pain sometimes radiates to lower back and sometimes radiates to perineum. Pain described as spasm. Denies rectal bleeding.       PAST MEDICAL HISTORY:      Past Medical History:  Diagnosis Date  . 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 Cataract cortical, senile 1997  . GERD (gastroesophageal reflux disease)   . Hyperlipidemia   . Hypertension   . Hypothyroidism, postsurgical   . Rheumatic fever         PAST SURGICAL HISTORY:        Past Surgical History:  Procedure Laterality Date  . CATARACT EXTRACTION  1997  . COLONOSCOPY  01/2011   divertics, 2 polyps  . EXCISION NASAL POLYPS    . Partial thyroidectomy    . Right parotid surgery     benign  . TONSILLECTOMY  1950         MEDICATIONS:  Encounter Medications        Outpatient Encounter Medications as of 07/20/2020  Medication Sig Dispense Refill  . amLODIPine (NORVASC) 5 MG tablet TAKE ONE TABLET BY MOUTH EVERY DAY 90 tablet 1  . aspirin 81 MG EC tablet Take 1 tablet by mouth once daily       . Compound Medication Med Name: Nifedipine 0.3% plus lidocaine 2% cream. Apply to anal area two times a day and after every bowel movement. 1 each 0  . doxazosin (CARDURA) 2 MG tablet TAKE ONE TABLET AT BEDTIME 90 tablet 3  . gabapentin (NEURONTIN) 100 MG capsule Take 200 mg by mouth nightly    . levothyroxine (SYNTHROID) 125 MCG tablet TAKE 1 TABLET EVERY DAY ON EMPTY STOMACHWITH A GLASS OF WATER AT LEAST 30-60 MINBEFORE BREAKFAST 90 tablet 1  . pravastatin (PRAVACHOL) 20 MG tablet TAKE ONE TABLET EVERY DAY 90  tablet 1  . spironolactone (ALDACTONE) 50 MG tablet Take 1 tablet (50 mg total) by mouth 2 (two) times daily 60 tablet 11  . VASCEPA 0.5 gram Cap TAKE 1 CAPSULE EVERY DAY 90 capsule 3  . olmesartan (BENICAR) 20 MG tablet TAKE ONE TABLET BY MOUTH EVERY DAY (Patient not taking: Reported on 07/20/2020) 90 tablet 1  . potassium chloride (KLOR-CON) 20 MEQ ER tablet Take 2 tablets (40 mEq total) by mouth 2 (two) times daily (Patient not taking: Reported on 07/08/2020  ) 120 tablet 2   No facility-administered encounter medications on file as of 07/20/2020.       ALLERGIES:   Azithromycin and Penicillins   SOCIAL HISTORY:  Social History          Socioeconomic History  . Marital status: Married    Spouse name: Not on file  . Number of children: Not on file  . Years of education: Not on file  . Highest education level: Not on file  Occupational History  . Not on file  Tobacco Use  . Smoking status: Former Smoker    Packs/day: 1.00    Years: 25.00    Pack years: 25.00    Types: Cigarettes    Start date: 07/11/1963    Quit  date: 07/09/1989    Years since quitting: 31.0  . Smokeless tobacco: Never Used  . Tobacco comment: pack a day for 25 years  Vaping Use  . Vaping Use: Never used  Substance and Sexual Activity  . Alcohol use: Yes    Alcohol/week: 6.0 standard drinks    Types: 6 Cans of beer per week    Comment: drink beer occasionally  . Drug use: No  . Sexual activity: Yes    Partners: Female    Birth control/protection: Post-menopausal  Other Topics Concern  . Not on file  Social History Narrative  . Not on file   Social Determinants of Health   Financial Resource Strain: Not on file  Food Insecurity: Not on file  Transportation Needs: Not on file      FAMILY HISTORY:       Family History  Problem Relation Age of Onset  . Brain cancer Father   . COPD Mother   . Mental illness Other   . Asthma Son   . Deep vein thrombosis  (DVT or abnormal blood clot formation) Son   . Asthma Brother   . Diabetes type II Brother      GENERAL REVIEW OF SYSTEMS:   General ROS: negative for - chills, fatigue, fever, weight gain or weight loss Allergy and Immunology ROS: negative for - hives  Hematological and Lymphatic ROS: negative for - bleeding problems or bruising, negative for palpable nodes Endocrine ROS: negative for - heat or cold intolerance, hair changes Respiratory ROS: negative for - cough, shortness of breath or wheezing Cardiovascular ROS: no chest pain or palpitations GI ROS: negative for nausea, vomiting, abdominal pain, diarrhea, constipation Musculoskeletal ROS: negative for - joint swelling or muscle pain Neurological ROS: negative for - confusion, syncope Dermatological ROS: negative for pruritus and rash  PHYSICAL EXAM:     Vitals:   07/20/20 0927  BP: 116/69  Pulse: 95  .  Ht:182.9 cm (6') Wt:91.2 kg (201 lb) HEN:IDPO surface area is 2.15 meters squared. Body mass index is 27.26 kg/m.Marland Kitchen   GENERAL: Alert, active, oriented x3  HEENT: Pupils equal reactive to light. Extraocular movements are intact. Sclera clear. Palpebral conjunctiva normal red color.Pharynx clear.  NECK: Supple with no palpable mass and no adenopathy.  LUNGS: Sound clear with no rales rhonchi or wheezes.  HEART: Regular rhythm S1 and S2 without murmur.  ABDOMEN: Soft and depressible, nontender with no palpable mass, no hepatomegaly.   RECTAL: there is an anal fissure on posterior anal area. Adequate sphincter tone.   EXTREMITIES: Well-developed well-nourished symmetrical with no dependent edema.  NEUROLOGICAL: Awake alert oriented, facial expression symmetrical, moving all extremities.      IMPRESSION:     Acute anal fissure [K60.0]         Patieent with chronic anal fissure that has been improving with nifedipine cream and fiber supplementation but continue with persistent pain. Pain has not resolved  even though patient has started with nifedipine cream for two months. He continue needing nifedipine cream to be able to feel relief and has not been able to wean the cream. We discuss about proceeding with the next step of doing a rectal exam under anesthesia to rule out other causes of perianal pain. During the rectal exam under anesthesia will proceed with chemical denervation of the internal anal sphincter. This could be of benefit since patients symptoms seems to be from spasm from the internal anal sphincter. Patient oriented about procedure. We discuss the risk of  incontinence, bleeding, infection, pain, among others. Patient voices understanding and agreed to proceed.    PLAN:  1. Rectal exam under anesthesia with with chemodenervation of internal anal sphincter (46505) 2. Continue high fiber diet 3. Continue rectal cream application 4. Contact us if you have any concern.   Patient verbalized understanding, all questions were answered, and were agreeable with the plan outlined above.   Herbert Pun, MD  Electronically signed by Herbert Pun, MD

## 2020-07-21 NOTE — H&P (View-Only) (Signed)
HISTORY OF PRESENT ILLNESS:    Tyler Young is a 75 y.o.male patient who comes for follow up of perianal fissure.  Patient reports that he continue having per in rectum. Pain has improve since first visit but continue to be persistent. The patient reports that pain in on perianal area. Pain exacerbated by bowel movement. Pain improve with nifedipine/lidocaine cream. Pain sometimes radiates to lower back and sometimes radiates to perineum. Pain described as spasm. Denies rectal bleeding.       PAST MEDICAL HISTORY:      Past Medical History:  Diagnosis Date  . 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 Cataract cortical, senile 1997  . GERD (gastroesophageal reflux disease)   . Hyperlipidemia   . Hypertension   . Hypothyroidism, postsurgical   . Rheumatic fever         PAST SURGICAL HISTORY:        Past Surgical History:  Procedure Laterality Date  . CATARACT EXTRACTION  1997  . COLONOSCOPY  01/2011   divertics, 2 polyps  . EXCISION NASAL POLYPS    . Partial thyroidectomy    . Right parotid surgery     benign  . TONSILLECTOMY  1950         MEDICATIONS:  Encounter Medications        Outpatient Encounter Medications as of 07/20/2020  Medication Sig Dispense Refill  . amLODIPine (NORVASC) 5 MG tablet TAKE ONE TABLET BY MOUTH EVERY DAY 90 tablet 1  . aspirin 81 MG EC tablet Take 1 tablet by mouth once daily       . Compound Medication Med Name: Nifedipine 0.3% plus lidocaine 2% cream. Apply to anal area two times a day and after every bowel movement. 1 each 0  . doxazosin (CARDURA) 2 MG tablet TAKE ONE TABLET AT BEDTIME 90 tablet 3  . gabapentin (NEURONTIN) 100 MG capsule Take 200 mg by mouth nightly    . levothyroxine (SYNTHROID) 125 MCG tablet TAKE 1 TABLET EVERY DAY ON EMPTY STOMACHWITH A GLASS OF WATER AT LEAST 30-60 MINBEFORE BREAKFAST 90 tablet 1  . pravastatin (PRAVACHOL) 20 MG tablet TAKE ONE TABLET EVERY DAY 90  tablet 1  . spironolactone (ALDACTONE) 50 MG tablet Take 1 tablet (50 mg total) by mouth 2 (two) times daily 60 tablet 11  . VASCEPA 0.5 gram Cap TAKE 1 CAPSULE EVERY DAY 90 capsule 3  . olmesartan (BENICAR) 20 MG tablet TAKE ONE TABLET BY MOUTH EVERY DAY (Patient not taking: Reported on 07/20/2020) 90 tablet 1  . potassium chloride (KLOR-CON) 20 MEQ ER tablet Take 2 tablets (40 mEq total) by mouth 2 (two) times daily (Patient not taking: Reported on 07/08/2020  ) 120 tablet 2   No facility-administered encounter medications on file as of 07/20/2020.       ALLERGIES:   Azithromycin and Penicillins   SOCIAL HISTORY:  Social History          Socioeconomic History  . Marital status: Married    Spouse name: Not on file  . Number of children: Not on file  . Years of education: Not on file  . Highest education level: Not on file  Occupational History  . Not on file  Tobacco Use  . Smoking status: Former Smoker    Packs/day: 1.00    Years: 25.00    Pack years: 25.00    Types: Cigarettes    Start date: 07/11/1963    Quit  date: 07/09/1989    Years since quitting: 31.0  . Smokeless tobacco: Never Used  . Tobacco comment: pack a day for 25 years  Vaping Use  . Vaping Use: Never used  Substance and Sexual Activity  . Alcohol use: Yes    Alcohol/week: 6.0 standard drinks    Types: 6 Cans of beer per week    Comment: drink beer occasionally  . Drug use: No  . Sexual activity: Yes    Partners: Female    Birth control/protection: Post-menopausal  Other Topics Concern  . Not on file  Social History Narrative  . Not on file   Social Determinants of Health   Financial Resource Strain: Not on file  Food Insecurity: Not on file  Transportation Needs: Not on file      FAMILY HISTORY:       Family History  Problem Relation Age of Onset  . Brain cancer Father   . COPD Mother   . Mental illness Other   . Asthma Son   . Deep vein thrombosis  (DVT or abnormal blood clot formation) Son   . Asthma Brother   . Diabetes type II Brother      GENERAL REVIEW OF SYSTEMS:   General ROS: negative for - chills, fatigue, fever, weight gain or weight loss Allergy and Immunology ROS: negative for - hives  Hematological and Lymphatic ROS: negative for - bleeding problems or bruising, negative for palpable nodes Endocrine ROS: negative for - heat or cold intolerance, hair changes Respiratory ROS: negative for - cough, shortness of breath or wheezing Cardiovascular ROS: no chest pain or palpitations GI ROS: negative for nausea, vomiting, abdominal pain, diarrhea, constipation Musculoskeletal ROS: negative for - joint swelling or muscle pain Neurological ROS: negative for - confusion, syncope Dermatological ROS: negative for pruritus and rash  PHYSICAL EXAM:     Vitals:   07/20/20 0927  BP: 116/69  Pulse: 95  .  Ht:182.9 cm (6') Wt:91.2 kg (201 lb) HEN:IDPO surface area is 2.15 meters squared. Body mass index is 27.26 kg/m.Marland Kitchen   GENERAL: Alert, active, oriented x3  HEENT: Pupils equal reactive to light. Extraocular movements are intact. Sclera clear. Palpebral conjunctiva normal red color.Pharynx clear.  NECK: Supple with no palpable mass and no adenopathy.  LUNGS: Sound clear with no rales rhonchi or wheezes.  HEART: Regular rhythm S1 and S2 without murmur.  ABDOMEN: Soft and depressible, nontender with no palpable mass, no hepatomegaly.   RECTAL: there is an anal fissure on posterior anal area. Adequate sphincter tone.   EXTREMITIES: Well-developed well-nourished symmetrical with no dependent edema.  NEUROLOGICAL: Awake alert oriented, facial expression symmetrical, moving all extremities.      IMPRESSION:     Acute anal fissure [K60.0]         Patieent with chronic anal fissure that has been improving with nifedipine cream and fiber supplementation but continue with persistent pain. Pain has not resolved  even though patient has started with nifedipine cream for two months. He continue needing nifedipine cream to be able to feel relief and has not been able to wean the cream. We discuss about proceeding with the next step of doing a rectal exam under anesthesia to rule out other causes of perianal pain. During the rectal exam under anesthesia will proceed with chemical denervation of the internal anal sphincter. This could be of benefit since patients symptoms seems to be from spasm from the internal anal sphincter. Patient oriented about procedure. We discuss the risk of  incontinence, bleeding, infection, pain, among others. Patient voices understanding and agreed to proceed.    PLAN:  1. Rectal exam under anesthesia with with chemodenervation of internal anal sphincter (46505) 2. Continue high fiber diet 3. Continue rectal cream application 4. Contact us if you have any concern.   Patient verbalized understanding, all questions were answered, and were agreeable with the plan outlined above.   Herbert Pun, MD  Electronically signed by Herbert Pun, MD

## 2020-07-22 ENCOUNTER — Other Ambulatory Visit: Payer: Medicare Other

## 2020-07-22 ENCOUNTER — Encounter
Admission: RE | Admit: 2020-07-22 | Discharge: 2020-07-22 | Disposition: A | Discharge status: Home / Self Care | Payer: Medicare Other | Source: Ambulatory Visit | Attending: General Surgery | Admitting: General Surgery

## 2020-07-22 DIAGNOSIS — Z20822 Contact with and (suspected) exposure to covid-19: Secondary | ICD-10-CM | POA: Insufficient documentation

## 2020-07-22 DIAGNOSIS — Z01818 Encounter for other preprocedural examination: Secondary | ICD-10-CM | POA: Insufficient documentation

## 2020-07-22 LAB — CBC
HCT: 42.4 % (ref 39.0–52.0)
Hemoglobin: 15.3 g/dL (ref 13.0–17.0)
MCH: 32.1 pg (ref 26.0–34.0)
MCHC: 36.1 g/dL — ABNORMAL HIGH (ref 30.0–36.0)
MCV: 89.1 fL (ref 80.0–100.0)
Platelets: 164 10*3/uL (ref 150–400)
RBC: 4.76 MIL/uL (ref 4.22–5.81)
RDW: 13.2 % (ref 11.5–15.5)
WBC: 6.7 10*3/uL (ref 4.0–10.5)
nRBC: 0 % (ref 0.0–0.2)

## 2020-07-22 LAB — SARS CORONAVIRUS 2 (TAT 6-24 HRS): SARS Coronavirus 2: NEGATIVE

## 2020-07-26 ENCOUNTER — Encounter
Admission: RE | Disposition: A | Discharge status: Home / Self Care | Payer: Self-pay | Source: Home / Self Care | Attending: General Surgery

## 2020-07-26 ENCOUNTER — Other Ambulatory Visit: Payer: Self-pay

## 2020-07-26 ENCOUNTER — Ambulatory Visit: Payer: Medicare Other | Admitting: Certified Registered Nurse Anesthetist

## 2020-07-26 ENCOUNTER — Encounter: Payer: Self-pay | Admitting: General Surgery

## 2020-07-26 ENCOUNTER — Ambulatory Visit
Admission: RE | Admit: 2020-07-26 | Discharge: 2020-07-26 | Disposition: A | Discharge status: Home / Self Care | Payer: Medicare Other | Attending: General Surgery | Admitting: General Surgery

## 2020-07-26 DIAGNOSIS — Z880 Allergy status to penicillin: Secondary | ICD-10-CM | POA: Insufficient documentation

## 2020-07-26 DIAGNOSIS — Z85828 Personal history of other malignant neoplasm of skin: Secondary | ICD-10-CM | POA: Insufficient documentation

## 2020-07-26 DIAGNOSIS — Z87891 Personal history of nicotine dependence: Secondary | ICD-10-CM | POA: Diagnosis not present

## 2020-07-26 DIAGNOSIS — Z79899 Other long term (current) drug therapy: Secondary | ICD-10-CM | POA: Diagnosis not present

## 2020-07-26 DIAGNOSIS — Z7982 Long term (current) use of aspirin: Secondary | ICD-10-CM | POA: Insufficient documentation

## 2020-07-26 DIAGNOSIS — Z79890 Hormone replacement therapy (postmenopausal): Secondary | ICD-10-CM | POA: Insufficient documentation

## 2020-07-26 DIAGNOSIS — K601 Chronic anal fissure: Secondary | ICD-10-CM | POA: Diagnosis present

## 2020-07-26 DIAGNOSIS — Z881 Allergy status to other antibiotic agents status: Secondary | ICD-10-CM | POA: Insufficient documentation

## 2020-07-26 SURGERY — EXAM UNDER ANESTHESIA
Anesthesia: General | Site: Rectum

## 2020-07-26 MED ORDER — FAMOTIDINE 20 MG PO TABS
ORAL_TABLET | ORAL | Status: AC
  Administered 2020-07-26: 20 mg via ORAL
  Filled 2020-07-26: qty 1

## 2020-07-26 MED ORDER — PROPOFOL 10 MG/ML IV BOLUS
INTRAVENOUS | Status: AC
  Filled 2020-07-26: qty 80

## 2020-07-26 MED ORDER — EPINEPHRINE PF 1 MG/ML IJ SOLN
INTRAMUSCULAR | Status: AC
  Filled 2020-07-26: qty 1

## 2020-07-26 MED ORDER — LIDOCAINE HCL (CARDIAC) PF 100 MG/5ML IV SOSY
PREFILLED_SYRINGE | INTRAVENOUS | Status: DC | PRN
  Administered 2020-07-26: 80 mg via INTRAVENOUS

## 2020-07-26 MED ORDER — BUPIVACAINE-EPINEPHRINE (PF) 0.5% -1:200000 IJ SOLN
INTRAMUSCULAR | Status: DC | PRN
  Administered 2020-07-26: 30 mL

## 2020-07-26 MED ORDER — OXYCODONE HCL 5 MG/5ML PO SOLN: 5.0000 mg | Freq: Once | ORAL | Status: DC | PRN

## 2020-07-26 MED ORDER — CHLORHEXIDINE GLUCONATE 0.12 % MT SOLN
OROMUCOSAL | Status: AC
  Administered 2020-07-26: 15 mL via OROMUCOSAL
  Filled 2020-07-26: qty 15

## 2020-07-26 MED ORDER — ONABOTULINUMTOXINA 100 UNITS IJ SOLR
INTRAMUSCULAR | Status: DC | PRN
Start: 2020-07-26 — End: 2020-07-26
  Administered 2020-07-26: 50 [IU] via INTRAMUSCULAR

## 2020-07-26 MED ORDER — PROPOFOL 500 MG/50ML IV EMUL
INTRAVENOUS | Status: DC | PRN
  Administered 2020-07-26: 80 ug/kg/min via INTRAVENOUS

## 2020-07-26 MED ORDER — CHLORHEXIDINE GLUCONATE 0.12 % MT SOLN: 15.0000 mL | Freq: Once | OROMUCOSAL | Status: AC

## 2020-07-26 MED ORDER — OXYCODONE HCL 5 MG PO TABS: 5.0000 mg | ORAL_TABLET | Freq: Once | ORAL | Status: DC | PRN

## 2020-07-26 MED ORDER — FENTANYL CITRATE (PF) 100 MCG/2ML IJ SOLN
INTRAMUSCULAR | Status: AC
  Filled 2020-07-26: qty 2

## 2020-07-26 MED ORDER — ORAL CARE MOUTH RINSE: 15.0000 mL | Freq: Once | OROMUCOSAL | Status: AC

## 2020-07-26 MED ORDER — FENTANYL CITRATE (PF) 100 MCG/2ML IJ SOLN
INTRAMUSCULAR | Status: DC | PRN
  Administered 2020-07-26 (×2): 50 ug via INTRAVENOUS

## 2020-07-26 MED ORDER — FENTANYL CITRATE (PF) 100 MCG/2ML IJ SOLN: 25.0000 ug | INTRAMUSCULAR | Status: DC | PRN

## 2020-07-26 MED ORDER — PROPOFOL 10 MG/ML IV BOLUS
INTRAVENOUS | Status: DC | PRN
  Administered 2020-07-26: 50 mg via INTRAVENOUS

## 2020-07-26 MED ORDER — ONDANSETRON HCL 4 MG/2ML IJ SOLN
INTRAMUSCULAR | Status: DC | PRN
  Administered 2020-07-26: 4 mg via INTRAVENOUS

## 2020-07-26 MED ORDER — LACTATED RINGERS IV SOLN: INTRAVENOUS | Status: DC

## 2020-07-26 MED ORDER — BUPIVACAINE HCL (PF) 0.5 % IJ SOLN
INTRAMUSCULAR | Status: AC
  Filled 2020-07-26: qty 30

## 2020-07-26 MED ORDER — FAMOTIDINE 20 MG PO TABS: 20.0000 mg | ORAL_TABLET | Freq: Once | ORAL | Status: AC

## 2020-07-26 SURGICAL SUPPLY — 23 items
BRIEF STRETCH MATERNITY 2XLG (MISCELLANEOUS) ×2 IMPLANT
COVER WAND RF STERILE (DRAPES) ×2 IMPLANT
DRAPE LAPAROTOMY 100X77 ABD (DRAPES) ×2 IMPLANT
DRAPE LEGGINS SURG 28X43 STRL (DRAPES) ×2 IMPLANT
DRAPE UNDER BUTTOCK W/FLU (DRAPES) ×2 IMPLANT
ELECT REM PT RETURN 9FT ADLT (ELECTROSURGICAL) ×2
ELECTRODE REM PT RTRN 9FT ADLT (ELECTROSURGICAL) ×1 IMPLANT
GLOVE SURG ENC MOIS LTX SZ6.5 (GLOVE) ×2 IMPLANT
GLOVE SURG UNDER POLY LF SZ6.5 (GLOVE) ×2 IMPLANT
GOWN STRL REUS W/ TWL LRG LVL3 (GOWN DISPOSABLE) ×2 IMPLANT
GOWN STRL REUS W/TWL LRG LVL3 (GOWN DISPOSABLE) ×4
KIT TURNOVER CYSTO (KITS) ×2 IMPLANT
LABEL OR SOLS (LABEL) ×2 IMPLANT
MANIFOLD NEPTUNE II (INSTRUMENTS) ×2 IMPLANT
NEEDLE HYPO 22GX1.5 SAFETY (NEEDLE) ×2 IMPLANT
PACK BASIN MINOR ARMC (MISCELLANEOUS) ×2 IMPLANT
PAD OB MATERNITY 4.3X12.25 (PERSONAL CARE ITEMS) ×2 IMPLANT
PAD PREP 24X41 OB/GYN DISP (PERSONAL CARE ITEMS) ×2 IMPLANT
STRAP SAFETY 5IN WIDE (MISCELLANEOUS) ×2 IMPLANT
SURGILUBE 2OZ TUBE FLIPTOP (MISCELLANEOUS) ×2 IMPLANT
SUT VIC AB 3-0 SH 27 (SUTURE)
SUT VIC AB 3-0 SH 27X BRD (SUTURE) IMPLANT
SYR 10ML LL (SYRINGE) ×2 IMPLANT

## 2020-07-26 NOTE — Discharge Instructions (Addendum)
°  Diet: Resume home heart healthy regular diet.   Activity: Return to regular activities.  Wound care: May shower with soapy water.   Medications: Resume all home medications.  Call office [phone]) at any time if any questions, worsening pain, fevers/chills, bleeding, drainage from incision site, or other concerns.    AMBULATORY SURGERY  DISCHARGE INSTRUCTIONS   1) The drugs that you were given will stay in your system until tomorrow so for the next 24 hours you should not:  A) Drive an automobile B) Make any legal decisions C) Drink any alcoholic beverage   2) You may resume regular meals tomorrow.  Today it is better to start with liquids and gradually work up to solid foods.  You may eat anything you prefer, but it is better to start with liquids, then soup and crackers, and gradually work up to solid foods.   3) Please notify your doctor immediately if you have any unusual bleeding, trouble breathing, redness and pain at the surgery site, drainage, fever, or pain not relieved by medication.    4) Additional Instructions:        Please contact your physician with any problems or Same Day Surgery at [phone], Monday through Friday 6 am to 4 pm, or [place] at [facility] number at [phone].

## 2020-07-26 NOTE — Transfer of Care (Signed)
Immediate Anesthesia Transfer of Care Note  Patient: Tyler Young  Procedure(s) Performed: EXAM UNDER ANESTHESIA w/ botox injection (N/A Rectum)  Patient Location: PACU  Anesthesia Type:General  Level of Consciousness: awake, drowsy and patient cooperative  Airway & Oxygen Therapy: Patient Spontanous Breathing  Post-op Assessment: Report given to RN, Post -op Vital signs reviewed and stable and Patient moving all extremities  Post vital signs: Reviewed and stable  Last Vitals:  Vitals Value Taken Time  BP 103/61 07/26/20 1158  Temp 36.7 C 07/26/20 1157  Pulse 67 07/26/20 1201  Resp 15 07/26/20 1201  SpO2 96 % 07/26/20 1201  Vitals shown include unvalidated device data.  Last Pain:  Vitals:   07/26/20 1157  TempSrc:   PainSc: 0-No pain         Complications: No complications documented.

## 2020-07-26 NOTE — Op Note (Addendum)
Preoperative diagnosis: Anal fissure   Postoperative diagnosis: Anal fissure .  Procedure: Anoscopy, chemodenervation of anal sphincter.  Surgeon: Dr. Windell Moment  Anesthesia: Spinal  Wound classification: Clean Contaminated  Indications: Patient is a 75 y.o. male was found to have symptomatic anal fissure refractory to medical managemen.   Findings: 1. Chronic posterior anal fissure 2. Internal and external anal sphincter identified and preserved 3. Adequate hemostasis  Description of procedure: The patient was brought to the operating room and sedation was given. Patient was placed in the lithotomy position. A time-out was completed verifying correct patient, procedure, site, positioning, and implant(s) and/or special equipment prior to beginning this procedure.  The perineum was prepped and draped in standard sterile fashion. Local anesthetic was injected as a perianal block. An anoscope was introduced and the anal fissure was identified.  50 units of botulinum toxin was prepared. This was injected in the intersphincteric groove to both sides of the fissure. No bleeding or hematoma identified. No other pathology identified. A gauze pad was tucked between the gluteal folds.  The patient tolerated the procedure well and was taken to the postanesthesia care unit in stable condition.   Specimen: None  Complications: none  EBL: None

## 2020-07-26 NOTE — Interval H&P Note (Signed)
History and Physical Interval Note:  07/26/2020 11:17 AM  Tyler Young  has presented today for surgery, with the diagnosis of K60.0 Acute anal fissure -- K59.4 Painful spasm of anus.  The various methods of treatment have been discussed with the patient and family. After consideration of risks, benefits and other options for treatment, the patient has consented to  Procedure(s): EXAM UNDER ANESTHESIA w/ botox injection (N/A) as a surgical intervention.  The patient's history has been reviewed, patient examined, no change in status, stable for surgery.  I have reviewed the patient's chart and labs.  Questions were answered to the patient's satisfaction.     Herbert Pun

## 2020-07-26 NOTE — Anesthesia Postprocedure Evaluation (Signed)
Anesthesia Post Note  Patient: Tyler Young  Procedure(s) Performed: EXAM UNDER ANESTHESIA w/ botox injection (N/A Rectum)  Patient location during evaluation: PACU Anesthesia Type: General Level of consciousness: awake and alert Pain management: pain level controlled Vital Signs Assessment: post-procedure vital signs reviewed and stable Respiratory status: spontaneous breathing, nonlabored ventilation, respiratory function stable and patient connected to nasal cannula oxygen Cardiovascular status: blood pressure returned to baseline and stable Postop Assessment: no apparent nausea or vomiting Anesthetic complications: no   No complications documented.   Last Vitals:  Vitals:   07/26/20 1213 07/26/20 1228  BP: 112/66 107/74  Pulse: 62 (!) 59  Resp: 13 16  Temp:  37 C  SpO2: 97% 97%    Last Pain:  Vitals:   07/26/20 1228  TempSrc:   PainSc: 0-No pain                 Precious Haws Chinenye Katzenberger

## 2020-07-26 NOTE — Anesthesia Preprocedure Evaluation (Signed)
Anesthesia Evaluation  Patient identified by MRN, date of birth, ID band Patient awake    Reviewed: Allergy & Precautions, H&P , NPO status , Patient's Chart, lab work & pertinent test results  History of Anesthesia Complications Negative for: history of anesthetic complications  Airway Mallampati: III  TM Distance: >3 FB Neck ROM: full    Dental  (+) Chipped   Pulmonary neg pulmonary ROS, neg shortness of breath, former smoker,    Pulmonary exam normal        Cardiovascular Exercise Tolerance: Good hypertension, (-) angina(-) Past MI Normal cardiovascular exam     Neuro/Psych negative neurological ROS  negative psych ROS   GI/Hepatic Neg liver ROS, GERD  Medicated and Controlled,  Endo/Other  Hypothyroidism   Renal/GU negative Renal ROS  negative genitourinary   Musculoskeletal   Abdominal   Peds  Hematology negative hematology ROS (+)   Anesthesia Other Findings Past Medical History: No date: Anginal pain (HCC)     Comment:  AT REST. STRESS/ECHO 07/25/18 No date: Cancer [facility])     Comment:  SKIN  No date: Chronic hypokalemia     Comment:  SEEING DR [facility] No date: Dyspnea     Comment:  DOE No date: GERD (gastroesophageal reflux disease) No date: History of kidney stones No date: Hypertension No date: Hypothyroidism No date: Neuropathy     Comment:  FEET No date: Pre-diabetes  Past Surgical History: No date: COLONOSCOPY No date: EYE SURGERY 09/19/2018: MYRINGOTOMY WITH TUBE PLACEMENT; Bilateral     Comment:  Procedure: MYRINGOTOMY WITH TUBE PLACEMENT;  Surgeon:               Carloyn Manner, MD;  Location: [facility] ORS;  Service:               ENT;  Laterality: Bilateral; No date: NASAL POLYP EXCISION No date: PAROTIDECTOMY; Right No date: THYROIDECTOMY, PARTIAL No date: TONSILLECTOMY  BMI    Body Mass Index: 26.85 kg/m      Reproductive/Obstetrics negative OB ROS                              Anesthesia Physical Anesthesia Plan  ASA: II  Anesthesia Plan: General   Post-op Pain Management:    Induction: Intravenous  PONV Risk Score and Plan: Propofol infusion and TIVA  Airway Management Planned: Natural Airway and Nasal Cannula  Additional Equipment:   Intra-op Plan:   Post-operative Plan:   Informed Consent: I have reviewed the patients History and Physical, chart, labs and discussed the procedure including the risks, benefits and alternatives for the proposed anesthesia with the patient or authorized representative who has indicated his/her understanding and acceptance.     Dental Advisory Given  Plan Discussed with: Anesthesiologist, CRNA and Surgeon  Anesthesia Plan Comments: (Patient consented for risks of anesthesia including but not limited to:  - adverse reactions to medications - risk of airway placement if required - damage to eyes, teeth, lips or other oral mucosa - nerve damage due to positioning  - sore throat or hoarseness - Damage to heart, brain, nerves, lungs, other parts of body or loss of life  Patient voiced understanding.)        Anesthesia Quick Evaluation

## 2021-03-07 ENCOUNTER — Ambulatory Visit
Admission: RE | Admit: 2021-03-07 | Discharge: 2021-03-07 | Disposition: A | Discharge status: Home / Self Care | Payer: Medicare Other | Source: Ambulatory Visit | Attending: Urology | Admitting: Urology

## 2021-03-07 ENCOUNTER — Other Ambulatory Visit: Payer: Self-pay

## 2021-03-07 DIAGNOSIS — Z87442 Personal history of urinary calculi: Secondary | ICD-10-CM

## 2021-03-07 NOTE — Progress Notes (Signed)
03/08/21 11:25 AM   Tyler Young [birth year] [id number]  Referring provider:  Tracie Harrier, MD [address] [facility] [place],  [place] [address] Chief Complaint  Patient presents with   Nephrolithiasis     HPI: Tyler Young is a 75 y.o.male with history of right ureteral stone who returns today for 1 year follow-up with KUB prior.   Last year, he underwent right ureteroscopy laser lithotripsy for an obstructing ureteral calculus.  This was while out of town traveling.  His stent was subsequently removed in our office.  KUB revealed punctate left sided renal calculus today.  Today he is doing well with no stone encounters. He has been working hard on his water intake which he has noticed has made a difference.  No stone episodes over the past year.    PMH: Past Medical History:  Diagnosis Date   Anginal pain ([place])    AT REST. STRESS/ECHO 07/25/18   Cancer ([place])    SKIN    Chronic hypokalemia    SEEING DR [facility]   Dyspnea    DOE   GERD (gastroesophageal reflux disease)    History of kidney stones    Hypertension    Hypothyroidism    Neuropathy    FEET   Pre-diabetes     Surgical History: Past Surgical History:  Procedure Laterality Date   COLONOSCOPY     EYE SURGERY     MYRINGOTOMY WITH TUBE PLACEMENT Bilateral 09/19/2018   Procedure: MYRINGOTOMY WITH TUBE PLACEMENT;  Surgeon: Carloyn Manner, MD;  Location: [facility] ORS;  Service: ENT;  Laterality: Bilateral;   NASAL POLYP EXCISION     PAROTIDECTOMY Right    THYROIDECTOMY, PARTIAL     TONSILLECTOMY      Home Medications:  Allergies as of 03/08/2021       Reactions   Doxycycline Other (See Comments)   Unsure of exact reaction type   Penicillins Other (See Comments)   Did it involve swelling of the face/tongue/throat, SOB, or low BP? Unknown Did it involve sudden or severe rash/hives, skin peeling, or any reaction on the inside of your mouth or nose? Unknown Did you need to  seek medical attention at a hospital or doctor's office? Yes When did it last happen? Childhood reaction at 75 years old       If all above answers are "NO", may proceed with cephalosporin use.        Medication List        Accurate as of March 08, 2021 11:25 AM. If you have any questions, ask your nurse or doctor.          amLODipine 5 MG tablet Commonly known as: NORVASC Take 5 mg by mouth daily.   diphenhydrAMINE 25 MG tablet Commonly known as: SOMINEX Take 25 mg by mouth at bedtime.   doxazosin 2 MG tablet Commonly known as: CARDURA Take 2 mg by mouth every evening.   gabapentin 100 MG capsule Commonly known as: NEURONTIN Take 200 mg by mouth at bedtime.   levothyroxine 125 MCG tablet Commonly known as: SYNTHROID Take 125 mcg by mouth daily before breakfast.   naproxen sodium 220 MG tablet Commonly known as: ALEVE Take 220-440 mg by mouth daily as needed (pain).   pravastatin 20 MG tablet Commonly known as: PRAVACHOL Take 20 mg by mouth every evening.   PRESCRIPTION MEDICATION Apply 1 application topically 2 (two) times daily. Nifedipine 0.3% and lidocaine 2%   spironolactone 50 MG tablet Commonly  known as: ALDACTONE Take 50 mg by mouth 2 (two) times daily.   triamcinolone 55 MCG/ACT Aero nasal inhaler Commonly known as: NASACORT Place 1-2 sprays into the nose daily as needed (allergies).   Vascepa 0.5 g Caps Generic drug: Icosapent Ethyl Take 0.5 g by mouth daily.        Allergies:  Allergies  Allergen Reactions   Doxycycline Other (See Comments)    Unsure of exact reaction type   Penicillins Other (See Comments)    Did it involve swelling of the face/tongue/throat, SOB, or low BP? Unknown Did it involve sudden or severe rash/hives, skin peeling, or any reaction on the inside of your mouth or nose? Unknown Did you need to seek medical attention at a hospital or doctor's office? Yes When did it last happen? Childhood reaction at 75 years old        If all above answers are "NO", may proceed with cephalosporin use.     Family History: No family history on file.  Social History:  reports that he quit smoking about 31 years ago. His smoking use included cigarettes. He has never used smokeless tobacco. He reports current alcohol use of about 2.0 - 3.0 standard drinks per week. He reports that he does not currently use drugs.   Physical Exam: BP 118/78   Pulse 99   Ht 6' (1.829 m)   Wt 200 lb (90.7 kg)   BMI 27.12 kg/m   Constitutional:  Alert and oriented, No acute distress. HEENT: [place] AT, moist mucus membranes.  Trachea midline, no masses. Cardiovascular: No clubbing, cyanosis, or edema. Respiratory: Normal respiratory effort, no increased work of breathing. Skin: No rashes, bruises or suspicious lesions. Neurologic: Grossly intact, no focal deficits, moving all 4 extremities. Psychiatric: Normal mood and affect.   Pertinent Imaging: CLINICAL DATA:  History of kidney stone.   EXAM: ABDOMEN - 1 VIEW   COMPARISON:  Renal ultrasound dated 02/25/2020.   FINDINGS: There is a punctate radiopaque focus over the upper pole of the left renal silhouette. No other radiopaque focus identified. There is no bowel dilatation or evidence of obstruction no free air. There is osteopenia with degenerative changes of the spine. No acute osseous pathology.   IMPRESSION: Punctate left renal calculus.     Electronically Signed   By: Anner Crete M.D.   On: 03/07/2021 21:54    I have personally reviewed the images and agree with radiologist interpretation.    Assessment & Plan:    Punctate left renal calculus  Recommend observation, punctuate stone  2. History of kidney stones  - Continue water intake  - no new stones have formed  -We discussed general stone prevention techniques including drinking plenty water with goal of producing 2.5 L urine daily, increased citric acid intake, avoidance of high oxalate  containing foods, and decreased salt intake.  Information about dietary recommendations given today.    Follow-up as needed   I,Kailey Littlejohn,acting as a scribe for Hollice Espy, MD.,have documented all relevant documentation on the behalf of Hollice Espy, MD,as directed by  Hollice Espy, MD while in the presence of Hollice Espy, MD.  I have reviewed the above documentation for accuracy and completeness, and I agree with the above.   Hollice Espy, MD    [facility] [address], [place], [place] [address] [phone]

## 2021-03-08 ENCOUNTER — Ambulatory Visit: Payer: Medicare Other | Admitting: Urology

## 2021-03-08 ENCOUNTER — Encounter: Payer: Self-pay | Admitting: Urology

## 2021-03-08 VITALS — BP 118/78 | HR 99 | Ht 72.0 in | Wt 200.0 lb

## 2021-03-08 DIAGNOSIS — Z87442 Personal history of urinary calculi: Secondary | ICD-10-CM

## 2021-04-07 ENCOUNTER — Other Ambulatory Visit (INDEPENDENT_AMBULATORY_CARE_PROVIDER_SITE_OTHER): Payer: Self-pay | Admitting: Vascular Surgery

## 2021-04-07 ENCOUNTER — Emergency Department: Payer: Medicare Other

## 2021-04-07 ENCOUNTER — Other Ambulatory Visit: Payer: Self-pay

## 2021-04-07 ENCOUNTER — Inpatient Hospital Stay
Admission: EM | Admit: 2021-04-07 | Discharge: 2021-04-09 | DRG: 270 | Disposition: A | Discharge status: Home / Self Care | Payer: Medicare Other | Attending: Internal Medicine | Admitting: Internal Medicine

## 2021-04-07 DIAGNOSIS — I82432 Acute embolism and thrombosis of left popliteal vein: Secondary | ICD-10-CM | POA: Diagnosis present

## 2021-04-07 DIAGNOSIS — I82409 Acute embolism and thrombosis of unspecified deep veins of unspecified lower extremity: Secondary | ICD-10-CM | POA: Diagnosis present

## 2021-04-07 DIAGNOSIS — I824Y2 Acute embolism and thrombosis of unspecified deep veins of left proximal lower extremity: Secondary | ICD-10-CM

## 2021-04-07 DIAGNOSIS — R739 Hyperglycemia, unspecified: Secondary | ICD-10-CM | POA: Diagnosis present

## 2021-04-07 DIAGNOSIS — Z825 Family history of asthma and other chronic lower respiratory diseases: Secondary | ICD-10-CM | POA: Diagnosis not present

## 2021-04-07 DIAGNOSIS — I2699 Other pulmonary embolism without acute cor pulmonale: Secondary | ICD-10-CM | POA: Diagnosis present

## 2021-04-07 DIAGNOSIS — D72829 Elevated white blood cell count, unspecified: Secondary | ICD-10-CM | POA: Diagnosis present

## 2021-04-07 DIAGNOSIS — Z79890 Hormone replacement therapy (postmenopausal): Secondary | ICD-10-CM | POA: Diagnosis not present

## 2021-04-07 DIAGNOSIS — K219 Gastro-esophageal reflux disease without esophagitis: Secondary | ICD-10-CM | POA: Diagnosis present

## 2021-04-07 DIAGNOSIS — N400 Enlarged prostate without lower urinary tract symptoms: Secondary | ICD-10-CM | POA: Diagnosis present

## 2021-04-07 DIAGNOSIS — R7303 Prediabetes: Secondary | ICD-10-CM | POA: Diagnosis present

## 2021-04-07 DIAGNOSIS — N179 Acute kidney failure, unspecified: Secondary | ICD-10-CM | POA: Diagnosis present

## 2021-04-07 DIAGNOSIS — I2609 Other pulmonary embolism with acute cor pulmonale: Secondary | ICD-10-CM | POA: Diagnosis not present

## 2021-04-07 DIAGNOSIS — E785 Hyperlipidemia, unspecified: Secondary | ICD-10-CM | POA: Diagnosis present

## 2021-04-07 DIAGNOSIS — Z87891 Personal history of nicotine dependence: Secondary | ICD-10-CM

## 2021-04-07 DIAGNOSIS — Z20822 Contact with and (suspected) exposure to covid-19: Secondary | ICD-10-CM | POA: Diagnosis present

## 2021-04-07 DIAGNOSIS — Z79899 Other long term (current) drug therapy: Secondary | ICD-10-CM | POA: Diagnosis not present

## 2021-04-07 DIAGNOSIS — I2694 Multiple subsegmental pulmonary emboli without acute cor pulmonale: Secondary | ICD-10-CM | POA: Diagnosis not present

## 2021-04-07 DIAGNOSIS — Z87442 Personal history of urinary calculi: Secondary | ICD-10-CM

## 2021-04-07 DIAGNOSIS — G629 Polyneuropathy, unspecified: Secondary | ICD-10-CM | POA: Diagnosis present

## 2021-04-07 DIAGNOSIS — Z880 Allergy status to penicillin: Secondary | ICD-10-CM

## 2021-04-07 DIAGNOSIS — N62 Hypertrophy of breast: Secondary | ICD-10-CM | POA: Diagnosis present

## 2021-04-07 DIAGNOSIS — Z881 Allergy status to other antibiotic agents status: Secondary | ICD-10-CM

## 2021-04-07 DIAGNOSIS — I82509 Chronic embolism and thrombosis of unspecified deep veins of unspecified lower extremity: Secondary | ICD-10-CM | POA: Diagnosis present

## 2021-04-07 DIAGNOSIS — I10 Essential (primary) hypertension: Secondary | ICD-10-CM | POA: Diagnosis present

## 2021-04-07 DIAGNOSIS — E039 Hypothyroidism, unspecified: Secondary | ICD-10-CM | POA: Diagnosis present

## 2021-04-07 DIAGNOSIS — Z85828 Personal history of other malignant neoplasm of skin: Secondary | ICD-10-CM

## 2021-04-07 DIAGNOSIS — R0902 Hypoxemia: Secondary | ICD-10-CM | POA: Diagnosis present

## 2021-04-07 DIAGNOSIS — I82412 Acute embolism and thrombosis of left femoral vein: Principal | ICD-10-CM | POA: Diagnosis present

## 2021-04-07 HISTORY — DX: Acute kidney failure, unspecified: N17.9

## 2021-04-07 HISTORY — DX: Acute embolism and thrombosis of unspecified deep veins of unspecified lower extremity: I82.409

## 2021-04-07 LAB — COMPREHENSIVE METABOLIC PANEL
ALT: 22 U/L (ref 0–44)
AST: 19 U/L (ref 15–41)
Albumin: 4.4 g/dL (ref 3.5–5.0)
Alkaline Phosphatase: 70 U/L (ref 38–126)
Anion gap: 10 (ref 5–15)
BUN: 23 mg/dL (ref 8–23)
CO2: 26 mmol/L (ref 22–32)
Calcium: 9.4 mg/dL (ref 8.9–10.3)
Chloride: 98 mmol/L (ref 98–111)
Creatinine, Ser: 1.41 mg/dL — ABNORMAL HIGH (ref 0.61–1.24)
GFR, Estimated: 53 mL/min — ABNORMAL LOW (ref >60)
Glucose, Bld: 195 mg/dL — ABNORMAL HIGH (ref 70–99)
Potassium: 4.5 mmol/L (ref 3.5–5.1)
Sodium: 134 mmol/L — ABNORMAL LOW (ref 135–145)
Total Bilirubin: 1.3 mg/dL — ABNORMAL HIGH (ref 0.3–1.2)
Total Protein: 7.6 g/dL (ref 6.5–8.1)

## 2021-04-07 LAB — URINALYSIS, COMPLETE (UACMP) WITH MICROSCOPIC
Bacteria, UA: NONE SEEN
Bilirubin Urine: NEGATIVE
Glucose, UA: NEGATIVE mg/dL
Hgb urine dipstick: NEGATIVE
Ketones, ur: NEGATIVE mg/dL
Leukocytes,Ua: NEGATIVE
Nitrite: NEGATIVE
Protein, ur: NEGATIVE mg/dL
RBC / HPF: NONE SEEN RBC/hpf (ref 0–5)
Specific Gravity, Urine: <1.005 — ABNORMAL LOW (ref 1.005–1.030)
Squamous Epithelial / HPF: NONE SEEN (ref 0–5)
WBC, UA: NONE SEEN WBC/hpf (ref 0–5)
pH: 5.5 (ref 5.0–8.0)

## 2021-04-07 LAB — CBC WITH DIFFERENTIAL/PLATELET
Abs Immature Granulocytes: 0.09 10*3/uL — ABNORMAL HIGH (ref 0.00–0.07)
Basophils Absolute: 0.1 10*3/uL (ref 0.0–0.1)
Basophils Relative: 0 %
Eosinophils Absolute: 0.2 10*3/uL (ref 0.0–0.5)
Eosinophils Relative: 2 %
HCT: 43.5 % (ref 39.0–52.0)
Hemoglobin: 15.5 g/dL (ref 13.0–17.0)
Immature Granulocytes: 1 %
Lymphocytes Relative: 13 %
Lymphs Abs: 1.7 10*3/uL (ref 0.7–4.0)
MCH: 32.3 pg (ref 26.0–34.0)
MCHC: 35.6 g/dL (ref 30.0–36.0)
MCV: 90.6 fL (ref 80.0–100.0)
Monocytes Absolute: 0.7 10*3/uL (ref 0.1–1.0)
Monocytes Relative: 5 %
Neutro Abs: 10 10*3/uL — ABNORMAL HIGH (ref 1.7–7.7)
Neutrophils Relative %: 79 %
Platelets: 179 10*3/uL (ref 150–400)
RBC: 4.8 MIL/uL (ref 4.22–5.81)
RDW: 12.9 % (ref 11.5–15.5)
WBC: 12.7 10*3/uL — ABNORMAL HIGH (ref 4.0–10.5)
nRBC: 0 % (ref 0.0–0.2)

## 2021-04-07 LAB — TROPONIN I (HIGH SENSITIVITY)
Troponin I (High Sensitivity): 3 ng/L (ref <18)
Troponin I (High Sensitivity): 2 ng/L (ref <18)

## 2021-04-07 LAB — BRAIN NATRIURETIC PEPTIDE: B Natriuretic Peptide: 10.8 pg/mL (ref 0.0–100.0)

## 2021-04-07 LAB — CBG MONITORING, ED
Glucose-Capillary: 113 mg/dL — ABNORMAL HIGH (ref 70–99)
Glucose-Capillary: 103 mg/dL — ABNORMAL HIGH (ref 70–99)
Glucose-Capillary: 142 mg/dL — ABNORMAL HIGH (ref 70–99)

## 2021-04-07 LAB — RESP PANEL BY RT-PCR (FLU A&B, COVID) ARPGX2
Influenza A by PCR: NEGATIVE
Influenza B by PCR: NEGATIVE
SARS Coronavirus 2 by RT PCR: NEGATIVE

## 2021-04-07 LAB — HEPARIN LEVEL (UNFRACTIONATED): Heparin Unfractionated: 0.82 IU/mL — ABNORMAL HIGH (ref 0.30–0.70)

## 2021-04-07 LAB — PROTIME-INR
INR: 1.1 (ref 0.8–1.2)
Prothrombin Time: 13.8 seconds (ref 11.4–15.2)

## 2021-04-07 LAB — APTT: aPTT: 32 seconds (ref 24–36)

## 2021-04-07 MED ORDER — GABAPENTIN 100 MG PO CAPS
200.0000 mg | ORAL_CAPSULE | Freq: Every day | ORAL | Status: DC
  Administered 2021-04-07 – 2021-04-08 (×2): 200 mg via ORAL
  Filled 2021-04-07 (×2): qty 2

## 2021-04-07 MED ORDER — POLYETHYLENE GLYCOL 3350 17 G PO PACK: 17.0000 g | PACK | Freq: Every day | ORAL | Status: DC | PRN

## 2021-04-07 MED ORDER — ACETAMINOPHEN 325 MG PO TABS: 650.0000 mg | ORAL_TABLET | Freq: Four times a day (QID) | ORAL | Status: DC | PRN

## 2021-04-07 MED ORDER — HYDROMORPHONE HCL 1 MG/ML IJ SOLN: 0.5000 mg | INTRAMUSCULAR | Status: DC | PRN

## 2021-04-07 MED ORDER — SODIUM CHLORIDE 0.9 % IV SOLN: Freq: Once | INTRAVENOUS | Status: AC

## 2021-04-07 MED ORDER — OXYCODONE HCL 5 MG PO TABS: 5.0000 mg | ORAL_TABLET | ORAL | Status: DC | PRN

## 2021-04-07 MED ORDER — INSULIN ASPART 100 UNIT/ML IJ SOLN
0.0000 [IU] | Freq: Three times a day (TID) | INTRAMUSCULAR | Status: DC
  Administered 2021-04-09: 1 [IU] via SUBCUTANEOUS
  Filled 2021-04-07: qty 1

## 2021-04-07 MED ORDER — LACTATED RINGERS IV SOLN: INTRAVENOUS | Status: DC

## 2021-04-07 MED ORDER — HEPARIN (PORCINE) 25000 UT/250ML-% IV SOLN
1400.0000 [IU]/h | INTRAVENOUS | Status: DC
  Administered 2021-04-07: 1500 [IU]/h via INTRAVENOUS
  Administered 2021-04-08: 1400 [IU]/h via INTRAVENOUS
  Filled 2021-04-07 (×2): qty 250

## 2021-04-07 MED ORDER — IOHEXOL 350 MG/ML SOLN
75.0000 mL | Freq: Once | INTRAVENOUS | Status: AC | PRN
  Administered 2021-04-07: 75 mL via INTRAVENOUS
  Filled 2021-04-07: qty 75

## 2021-04-07 MED ORDER — ONDANSETRON HCL 4 MG/2ML IJ SOLN: 4.0000 mg | Freq: Four times a day (QID) | INTRAMUSCULAR | Status: DC | PRN

## 2021-04-07 MED ORDER — PANTOPRAZOLE SODIUM 40 MG PO TBEC
40.0000 mg | DELAYED_RELEASE_TABLET | Freq: Every day | ORAL | Status: DC
  Administered 2021-04-09: 40 mg via ORAL
  Filled 2021-04-07: qty 1

## 2021-04-07 MED ORDER — DOXAZOSIN MESYLATE 2 MG PO TABS
2.0000 mg | ORAL_TABLET | Freq: Every evening | ORAL | Status: DC
  Administered 2021-04-07 – 2021-04-08 (×2): 2 mg via ORAL
  Filled 2021-04-07 (×5): qty 1

## 2021-04-07 MED ORDER — ICOSAPENT ETHYL 1 G PO CAPS
1.0000 g | ORAL_CAPSULE | Freq: Every day | ORAL | Status: DC
  Administered 2021-04-09: 1 g via ORAL
  Filled 2021-04-07 (×2): qty 1

## 2021-04-07 MED ORDER — MELATONIN 5 MG PO TABS: 5.0000 mg | ORAL_TABLET | Freq: Every evening | ORAL | Status: DC | PRN

## 2021-04-07 MED ORDER — LEVOTHYROXINE SODIUM 50 MCG PO TABS
125.0000 ug | ORAL_TABLET | Freq: Every day | ORAL | Status: DC
  Administered 2021-04-09: 125 ug via ORAL
  Filled 2021-04-07: qty 1

## 2021-04-07 MED ORDER — PRAVASTATIN SODIUM 20 MG PO TABS
20.0000 mg | ORAL_TABLET | Freq: Every evening | ORAL | Status: DC
  Administered 2021-04-07 – 2021-04-08 (×2): 20 mg via ORAL
  Filled 2021-04-07 (×2): qty 1

## 2021-04-07 MED ORDER — INSULIN ASPART 100 UNIT/ML IJ SOLN: 0.0000 [IU] | Freq: Every day | INTRAMUSCULAR | Status: DC

## 2021-04-07 MED ORDER — AMLODIPINE BESYLATE 5 MG PO TABS
2.5000 mg | ORAL_TABLET | Freq: Every day | ORAL | Status: DC
  Administered 2021-04-09: 2.5 mg via ORAL
  Filled 2021-04-07: qty 1

## 2021-04-07 MED ORDER — SPIRONOLACTONE 25 MG PO TABS
50.0000 mg | ORAL_TABLET | Freq: Two times a day (BID) | ORAL | Status: DC
  Administered 2021-04-07: 50 mg via ORAL
  Filled 2021-04-07: qty 2

## 2021-04-07 MED ORDER — HEPARIN BOLUS VIA INFUSION
6000.0000 [IU] | Freq: Once | INTRAVENOUS | Status: AC
  Administered 2021-04-07: 6000 [IU] via INTRAVENOUS
  Filled 2021-04-07: qty 6000

## 2021-04-07 MED ORDER — SODIUM CHLORIDE 0.9 % IV SOLN: INTRAVENOUS | Status: DC

## 2021-04-07 NOTE — ED Notes (Signed)
Sent blue tube with repeat trop tube.

## 2021-04-07 NOTE — ED Notes (Signed)
Dinner tray given

## 2021-04-07 NOTE — ED Provider Notes (Signed)
HPI: Pt is a 75 y.o. male who presents with complaints of leg swelling   The patient p/w  Left leg swelling 36 hours also some SOB few weeks that is different then normal .  ROS: Denies fever, chest pain, vomiting  Past Medical History:  Diagnosis Date   Anginal pain ([place])    AT REST. STRESS/ECHO 07/25/18   Cancer ([place])    SKIN    Chronic hypokalemia    SEEING DR [facility]   Dyspnea    DOE   GERD (gastroesophageal reflux disease)    History of kidney stones    Hypertension    Hypothyroidism    Neuropathy    FEET   Pre-diabetes    There were no vitals filed for this visit.  Focused Physical Exam: Gen: No acute distress Head: atraumatic, normocephalic Eyes: Extraocular movements grossly intact; conjunctiva clear CV: Reg rate  Lung: No increased WOB, no stridor GI: ND, no obvious masses Neuro: Alert and awake Left leg swollen, 2+ distal pulse   Medical Decision Making and Plan: Given the patient's initial medical screening exam, the following diagnostic evaluation has been ordered. The patient will be placed in the appropriate treatment space, once one is available, to complete the evaluation and treatment. I have discussed the plan of care with the patient and I have advised the patient that an ED physician or mid-level practitioner will reevaluate their condition after the test results have been received, as the results may give them additional insight into the type of treatment they may need.   Diagnostics:US, given high concern for DVT will get CT to rule out PE as well given SOB.   Treatments: none immediately   Vanessa Asbury, MD 04/07/21 1221

## 2021-04-07 NOTE — ED Notes (Signed)
Herpin gtt running through R IV and LR running through L IV.

## 2021-04-07 NOTE — H&P (View-Only) (Signed)
[facility] Vascular Consult Note  MRN : [id number]  Tyler Young is a 75 y.o. (May 08, 1946) male who presents with chief complaint of  Chief Complaint  Patient presents with   Leg Swelling   History of Present Illness:  Tyler Young is a 76 year old male with medical history significant for nephrolithiasis, hypothyroidism, hyperlipidemia, hypertension, GERD, peripheral neuropathy, anal fissure, s/p left lateral internal sphincterotomy 02/11/2021 who presented to [facility] ED at the recommendation of his primary care provider due to left lower extremity edema, erythema and tenderness of 3-days duration and more painful on the day of presentation.    Associated with dyspnea with exertion.  No recent lengthy trips.  Sphincterectomy on 02/11/21 but stayed active.  No cough.  No hemoptysis.  No chest pain.  He endorses that his son had an unprovoked DVT.  He stays active by doing house chores, mowing the lawn, walking 2 miles a day, etc.    Work-up in the ED revealed large left lower extremity DVT extensive and occlusive, bilateral pulmonary embolism with no heart strain seen on CT scan.    Vascular surgery was consulted by Dr. Nevada Crane for possible endovascular intervention in the setting of DVT and PE.  Current Facility-Administered Medications  Medication Dose Route Frequency Provider Last Rate Last Admin   acetaminophen (TYLENOL) tablet 650 mg  650 mg Oral Q6H PRN Kayleen Memos, DO       [START ON 04/08/2021] amLODipine (NORVASC) tablet 2.5 mg  2.5 mg Oral Daily Hall, Carole N, DO       doxazosin (CARDURA) tablet 2 mg  2 mg Oral QPM Hall, Carole N, DO       gabapentin (NEURONTIN) capsule 200 mg  200 mg Oral QHS Hall, Carole N, DO       heparin ADULT infusion 100 units/mL (25000 units/258mL)  1,500 Units/hr Intravenous Continuous Kayleen Memos, DO 15 mL/hr at 04/07/21 1512 1,500 Units/hr at 04/07/21 1512   HYDROmorphone (DILAUDID) injection 0.5 mg  0.5 mg Intravenous Q4H PRN  Kayleen Memos, DO       [START ON 04/08/2021] icosapent Ethyl (VASCEPA) 1 g capsule 1 g  1 g Oral Daily Hall, Carole N, DO       insulin aspart (novoLOG) injection 0-5 Units  0-5 Units Subcutaneous QHS Hall, Carole N, DO       insulin aspart (novoLOG) injection 0-9 Units  0-9 Units Subcutaneous TID WC Hall, Carole N, DO       lactated ringers infusion   Intravenous Continuous Kayleen Memos, DO 30 mL/hr at 04/07/21 1522 New Bag at 04/07/21 1522   [START ON 04/08/2021] levothyroxine (SYNTHROID) tablet 125 mcg  125 mcg Oral QAC breakfast Irene Pap N, DO       melatonin tablet 5 mg  5 mg Oral QHS PRN Irene Pap N, DO       ondansetron (ZOFRAN) injection 4 mg  4 mg Intravenous Q6H PRN Irene Pap N, DO       oxyCODONE (Oxy IR/ROXICODONE) immediate release tablet 5 mg  5 mg Oral Q4H PRN Hall, Carole N, DO       [START ON 04/08/2021] pantoprazole (PROTONIX) EC tablet 40 mg  40 mg Oral Daily Hall, Carole N, DO       polyethylene glycol (MIRALAX / GLYCOLAX) packet 17 g  17 g Oral Daily PRN Irene Pap N, DO       pravastatin (PRAVACHOL) tablet 20 mg  20 mg Oral  QPM Hall, Carole N, DO       spironolactone (ALDACTONE) tablet 50 mg  50 mg Oral BID Kayleen Memos, DO       Current Outpatient Medications  Medication Sig Dispense Refill   amLODipine (NORVASC) 5 MG tablet Take 2.5 mg by mouth daily.     diphenhydrAMINE (SOMINEX) 25 MG tablet Take 25 mg by mouth at bedtime.     doxazosin (CARDURA) 2 MG tablet Take 2 mg by mouth every evening.     gabapentin (NEURONTIN) 100 MG capsule Take 200 mg by mouth at bedtime.     levothyroxine (SYNTHROID, LEVOTHROID) 125 MCG tablet Take 125 mcg by mouth daily before breakfast.     naproxen sodium (ALEVE) 220 MG tablet Take 220-440 mg by mouth daily as needed (pain).     omeprazole (PRILOSEC) 20 MG capsule Take 20 mg by mouth every morning.     pravastatin (PRAVACHOL) 20 MG tablet Take 20 mg by mouth every evening.     spironolactone (ALDACTONE) 50 MG tablet Take  50 mg by mouth 2 (two) times daily.     triamcinolone (NASACORT) 55 MCG/ACT AERO nasal inhaler Place 1-2 sprays into the nose daily as needed (allergies).     VASCEPA 0.5 g CAPS Take 0.5 g by mouth daily.     PRESCRIPTION MEDICATION Apply 1 application topically 2 (two) times daily. Nifedipine 0.3% and lidocaine 2%     Past Medical History:  Diagnosis Date   Anginal pain ([place])    AT REST. STRESS/ECHO 07/25/18   Cancer ([place])    SKIN    Chronic hypokalemia    SEEING DR [facility]   Dyspnea    DOE   GERD (gastroesophageal reflux disease)    History of kidney stones    Hypertension    Hypothyroidism    Neuropathy    FEET   Pre-diabetes    Past Surgical History:  Procedure Laterality Date   COLONOSCOPY     EYE SURGERY     MYRINGOTOMY WITH TUBE PLACEMENT Bilateral 09/19/2018   Procedure: MYRINGOTOMY WITH TUBE PLACEMENT;  Surgeon: Carloyn Manner, MD;  Location: [facility] ORS;  Service: ENT;  Laterality: Bilateral;   NASAL POLYP EXCISION     PAROTIDECTOMY Right    THYROIDECTOMY, PARTIAL     TONSILLECTOMY     Social History Social History   Tobacco Use   Smoking status: Former    Types: Cigarettes    Quit date: 09/16/1989    Years since quitting: 31.5   Smokeless tobacco: Never  Vaping Use   Vaping Use: Never used  Substance Use Topics   Alcohol use: Yes    Alcohol/week: 2.0 - 3.0 standard drinks    Types: 2 - 3 Cans of beer per week    Comment: WEEKENDS   Drug use: Not Currently   Family History: No family history on file. Denies family history of peripheral artery disease, venous disease or renal disease.  Allergies  Allergen Reactions   Doxycycline Other (See Comments)    Unsure of exact reaction type   Penicillins Other (See Comments)    Did it involve swelling of the face/tongue/throat, SOB, or low BP? Unknown Did it involve sudden or severe rash/hives, skin peeling, or any reaction on the inside of your mouth or nose? Unknown Did you need to seek medical attention at  a hospital or doctor's office? Yes When did it last happen? Childhood reaction at 75 years old       If all  above answers are "NO", may proceed with cephalosporin use.    REVIEW OF SYSTEMS (Negative unless checked)  Constitutional: [] Weight loss  [] Fever  [] Chills Cardiac: [] Chest pain   [] Chest pressure   [] Palpitations   [] Shortness of breath when laying flat   [] Shortness of breath at rest   [x] Shortness of breath with exertion. Vascular:  [] Pain in legs with walking   [] Pain in legs at rest   [] Pain in legs when laying flat   [] Claudication   [] Pain in feet when walking  [] Pain in feet at rest  [] Pain in feet when laying flat   [] History of DVT   [] Phlebitis   [x] Swelling in legs   [] Varicose veins   [] Non-healing ulcers Pulmonary:   [] Uses home oxygen   [] Productive cough   [] Hemoptysis   [] Wheeze  [] COPD   [] Asthma Neurologic:  [] Dizziness  [] Blackouts   [] Seizures   [] History of stroke   [] History of TIA  [] Aphasia   [] Temporary blindness   [] Dysphagia   [] Weakness or numbness in arms   [] Weakness or numbness in legs Musculoskeletal:  [] Arthritis   [] Joint swelling   [] Joint pain   [] Low back pain Hematologic:  [] Easy bruising  [] Easy bleeding   [] Hypercoagulable state   [] Anemic  [] Hepatitis Gastrointestinal:  [] Blood in stool   [] Vomiting blood  [] Gastroesophageal reflux/heartburn   [] Difficulty swallowing. Genitourinary:  [] Chronic kidney disease   [] Difficult urination  [] Frequent urination  [] Burning with urination   [] Blood in urine Skin:  [] Rashes   [] Ulcers   [] Wounds Psychological:  [] History of anxiety   []  History of major depression.  Physical Examination  Vitals:   04/07/21 1223 04/07/21 1427 04/07/21 1500  BP: 137/90 129/84 127/73  Pulse: 98 74 76  Resp: 18 17 19   Temp: 98.3 F (36.8 C)    TempSrc: Oral    SpO2: 98% 100% 100%  Weight: 90.7 kg    Height: 6' (1.829 m)     Body mass index is 27.12 kg/m. Gen:  WD/WN, NAD Head: [place]/AT, No temporalis wasting.  Prominent temp pulse not noted. Ear/Nose/Throat: Hearing grossly intact, nares w/o erythema or drainage, oropharynx w/o Erythema/Exudate Eyes: Sclera non-icteric, conjunctiva clear Neck: Trachea midline.  No JVD.  Pulmonary:  Good air movement, respirations not labored, equal bilaterally.  Cardiac: RRR, normal S1, S2. Vascular:  Vessel Right Left  Radial Palpable Palpable  Ulnar Palpable Palpable  Brachial Palpable Palpable  Carotid Palpable, without bruit Palpable, without bruit  Aorta Not palpable N/A  Femoral Palpable Palpable  Popliteal Palpable Palpable  PT Palpable Non-Palpable  DP Palpable Non-Palpable   Left lower extremity: Thigh soft.  Calf soft.  Hard to palpate pedal pulses due to edema however the extremity is warm to the toes.  Good capillary refill.  Warm.  There is no acute vascular compromise noted on exam at this time.  Water/sensory is intact.  Gastrointestinal: soft, non-tender/non-distended. No guarding/reflex.  Musculoskeletal: M/S 5/5 throughout.  Extremities without ischemic changes.  No deformity or atrophy. No edema. Neurologic: Sensation grossly intact in extremities.  Symmetrical.  Speech is fluent. Motor exam as listed above. Psychiatric: Judgment intact, Mood & affect appropriate for pt's clinical situation. Dermatologic: No rashes or ulcers noted.  No cellulitis or open wounds. Lymph : No Cervical, Axillary, or Inguinal lymphadenopathy.  CBC Lab Results  Component Value Date   WBC 12.7 (H) 04/07/2021   HGB 15.5 04/07/2021   HCT 43.5 04/07/2021   MCV 90.6 04/07/2021   PLT 179 04/07/2021  BMET    Component Value Date/Time   NA 134 (L) 04/07/2021 1230   K 4.5 04/07/2021 1230   CL 98 04/07/2021 1230   CO2 26 04/07/2021 1230   GLUCOSE 195 (H) 04/07/2021 1230   BUN 23 04/07/2021 1230   CREATININE 1.41 (H) 04/07/2021 1230   CALCIUM 9.4 04/07/2021 1230   GFRNONAA 53 (L) 04/07/2021 1230   Estimated Creatinine Clearance: 51.2 mL/min (A) (by C-G  formula based on SCr of 1.41 mg/dL (H)).  COAG Lab Results  Component Value Date   INR 1.1 04/07/2021   Radiology CT Angio Chest PE W and/or Wo Contrast  Result Date: 04/07/2021 CLINICAL DATA:  Short of breath with exertion.  Left leg swelling. EXAM: CT ANGIOGRAPHY CHEST WITH CONTRAST TECHNIQUE: Multidetector CT imaging of the chest was performed using the standard protocol during bolus administration of intravenous contrast. Multiplanar CT image reconstructions and MIPs were obtained to evaluate the vascular anatomy. CONTRAST:  23mL OMNIPAQUE IOHEXOL 350 MG/ML SOLN COMPARISON:  01/13/2002 plain film FINDINGS: Cardiovascular: The quality of this exam for evaluation of pulmonary embolism is sufficient. Acute pulmonary artery filling defects including within the distal right pulmonary artery on 41/4 and within lobar to segmental branches of the right lower lobe including on 51 through 55 of series 4. Smaller volume left-sided emboli including to the left lower lobe on 46/4. No evidence of right heart strain. Aortic atherosclerosis. Tortuous thoracic aorta. Mild cardiomegaly, without pericardial effusion. Lad coronary artery calcification. Mediastinum/Nodes: Partial thyroidectomy. No mediastinal or hilar adenopathy. Tiny hiatal hernia. Mild distal esophageal wall thickening. Lungs/Pleura: No pleural fluid. Left lower lobe calcified granuloma laterally. Areas of mild peripheral predominant airspace and ground-glass opacity involving the right upper lobe on 43/6, right middle lobe on 51/6, and right lower lobe on 73/6. Upper Abdomen: Nonspecific caudate lobe enlargement. Normal imaged portions of the spleen, pancreas, gallbladder, adrenal glands, left kidney Musculoskeletal: Marked left and moderate right sided gynecomastia. 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 Review of the MIP images confirms the above findings. IMPRESSION: 1. Bilateral pulmonary emboli, without evidence of right heart strain. 2. Areas of peripheral  predominant airspace and ground-glass opacity, favored to represent infarcts. 3. Tiny hiatal hernia. Esophageal wall thickening suggests esophagitis. 4. Coronary artery atherosclerosis. Aortic Atherosclerosis (ICD10-I70.0). 5. Bilateral gynecomastia. These results were called by telephone at the time of interpretation on 04/07/2021 at 3:05 pm to provider dr. Kerman Passey, who verbally acknowledged these results. Electronically Signed   By: Abigail Miyamoto M.D.   On: 04/07/2021 15:07   US Venous Img Lower Unilateral Left  Result Date: 04/07/2021 CLINICAL DATA:  Leg swelling EXAM: Left LOWER EXTREMITY VENOUS DOPPLER ULTRASOUND TECHNIQUE: Gray-scale sonography with graded compression, as well as color Doppler and duplex ultrasound were performed to evaluate the lower extremity deep venous systems from the level of the common femoral vein and including the common femoral, femoral, profunda femoral, popliteal and calf veins including the posterior tibial, peroneal and gastrocnemius veins when visible. The superficial great saphenous vein was also interrogated. Spectral Doppler was utilized to evaluate flow at rest and with distal augmentation maneuvers in the common femoral, femoral and popliteal veins. COMPARISON:  None. FINDINGS: Contralateral Common Femoral Vein: Respiratory phasicity is normal and symmetric with the symptomatic side. No evidence of thrombus. Normal compressibility. Common Femoral Vein: There is occlusive thrombus in the left common femoral vein. Saphenofemoral Junction: There is occlusive thrombus at the saphenofemoral junction. Profunda Femoral Vein: There is occlusive thrombus in the profunda femoral vein. Femoral Vein: There is  occlusive thrombus in the femoral vein. Popliteal Vein: There is occlusive thrombus in the popliteal vein. Calf Veins: There is occlusive thrombus in the imaged calf veins. Superficial Great Saphenous Vein: There is apparent Doppler flow within the imaged portions of the  great saphenous vein. Venous Reflux:  None. Other Findings:  None. IMPRESSION: Extensive occlusive thrombus throughout the left lower extremity from the calf to the groin. These results were called by telephone at the time of interpretation on 04/07/2021 at 1:56 Pm to provider Harvest Dark, who verbally acknowledged these results. Electronically Signed   By: Valetta Mole M.D.   On: 04/07/2021 14:11    Assessment/Plan Tyler Young is a 75 year old male with medical history significant for nephrolithiasis, hypothyroidism, hyperlipidemia, hypertension, GERD, peripheral neuropathy, anal fissure, s/p left lateral internal sphincterotomy 02/11/2021 who presented to [facility] ED at the recommendation of his primary care provider due to left lower extremity edema, erythema and tenderness of 3-days duration and more painful on the day of presentation found to have extensive left lower extremity DVT and PE.  1.  Extensive left lower extremity DVT: Patient presents with 3 days of swelling, discomfort and erythema to the left lower extremity.  Patient was found to have extensive left lower extremity DVT:  "Extensive occlusive thrombus throughout the left lower extremity from the calf to the groin".  In the setting of an extensive proximal DVT would recommend the patient undergo a left lower extremity peripheral venous thrombectomy / thrombolysis in an attempt to lessen the clot burden, decrease the risk of worsening pulmonary embolism and postphlebitic symptoms.  Procedure, risks and benefits were explained to the patient.  All questions were answered.  The patient wishes to proceed.  We will plan on this tomorrow with Dr. Delana Meyer.  2.  Pulmonary Embolism: Patient notes mild shortness of breath with exertion.  Denies any shortness of breath or chest pain at rest.  Normal oxygen saturations on room air.  CTA of the chest was notable for:  1. Bilateral pulmonary emboli, without evidence of right heart strain. 2.  Areas of peripheral predominant airspace and ground-glass opacity, favored to represent infarcts.  At this time, the patient is relatively asymptomatic in regard to the pulmonary embolus.  In the absence of symptoms, and right heart strain would not move forward with a pulmonary thrombectomy / thrombolysis at this time.  We do have a 2-week window to reassess if the patient were to become more symptomatic.  The patient expresses understanding.  3.  Anticoagulation: Patient has DVT and PE. Guidelines suggest anticoagulation for approximately 1 year This was discussed in detail with the patient. He expresses understanding He is currently on heparin and will be transition to p.o. oral anticoagulation after the procedure  Discussed with Dr. Francene Castle, PA-C 04/07/2021 3:35 PM  This note was created with Dragon medical transcription system.  Any error is purely unintentional.

## 2021-04-07 NOTE — ED Notes (Signed)
Pt given urinal; stretcher rail up; stretcher locked low. Pt given water as requested. Pt notified dinner tray to arrive around 5pm; declined offer of snack.

## 2021-04-07 NOTE — ED Notes (Signed)
Called lab to add on APTT and INR. State they will run it now.

## 2021-04-07 NOTE — ED Provider Notes (Addendum)
[facility] Emergency Department Provider Note  Time seen: 1:56 PM  I have reviewed the triage vital signs and the nursing notes.   HISTORY  Chief Complaint Leg Swelling   HPI Tyler Young is a 75 y.o. male with a past medical history of hypertension, hypothyroidism, presents to the emergency department for left leg pain and swelling.  According to the patient over the past 2 to 3 days he has noticed discomfort and swelling to the left leg.  No chest pain or shortness of breath.  No history of DVT previously.  Patient states he has become more uncomfortable today so he came to the emergency department for evaluation.   Past Medical History:  Diagnosis Date   Anginal pain ([place])    AT REST. STRESS/ECHO 07/25/18   Cancer ([place])    SKIN    Chronic hypokalemia    SEEING [facility]   Dyspnea    DOE   GERD (gastroesophageal reflux disease)    History of kidney stones    Hypertension    Hypothyroidism    Neuropathy    FEET   Pre-diabetes     There are no problems to display for this patient.   Past Surgical History:  Procedure Laterality Date   COLONOSCOPY     EYE SURGERY     MYRINGOTOMY WITH TUBE PLACEMENT Bilateral 09/19/2018   Procedure: MYRINGOTOMY WITH TUBE PLACEMENT;  Surgeon: Carloyn Manner, MD;  Location: [facility] ORS;  Service: ENT;  Laterality: Bilateral;   NASAL POLYP EXCISION     PAROTIDECTOMY Right    THYROIDECTOMY, PARTIAL     TONSILLECTOMY      Prior to Admission medications   Medication Sig Start Date End Date Taking? Authorizing Provider  amLODipine (NORVASC) 5 MG tablet Take 5 mg by mouth daily. 10/21/19   [provider]  diphenhydrAMINE (SOMINEX) 25 MG tablet Take 25 mg by mouth at bedtime.    [provider]  doxazosin (CARDURA) 2 MG tablet Take 2 mg by mouth every evening. 07/11/18   [provider]  gabapentin (NEURONTIN) 100 MG capsule Take 200 mg by mouth at bedtime. 07/24/18   [provider]   levothyroxine (SYNTHROID, LEVOTHROID) 125 MCG tablet Take 125 mcg by mouth daily before breakfast.    [provider]  naproxen sodium (ALEVE) 220 MG tablet Take 220-440 mg by mouth daily as needed (pain).    [provider]  pravastatin (PRAVACHOL) 20 MG tablet Take 20 mg by mouth every evening. 07/19/18   [provider]  PRESCRIPTION MEDICATION Apply 1 application topically 2 (two) times daily. Nifedipine 0.3% and lidocaine 2%    [provider]  spironolactone (ALDACTONE) 50 MG tablet Take 50 mg by mouth 2 (two) times daily.    [provider]  triamcinolone (NASACORT) 55 MCG/ACT AERO nasal inhaler Place 1-2 sprays into the nose daily as needed (allergies).    [provider]  VASCEPA 0.5 g CAPS Take 0.5 g by mouth daily. 11/28/19   [provider]    Allergies  Allergen Reactions   Doxycycline Other (See Comments)    Unsure of exact reaction type   Penicillins Other (See Comments)    Did it involve swelling of the face/tongue/throat, SOB, or low BP? Unknown Did it involve sudden or severe rash/hives, skin peeling, or any reaction on the inside of your mouth or nose? Unknown Did you need to seek medical attention at a hospital or doctor's office? Yes When did  it last happen? Childhood reaction at 75 years old       If all above answers are "NO", may proceed with cephalosporin use.     No family history on file.  Social History Social History   Tobacco Use   Smoking status: Former    Types: Cigarettes    Quit date: 09/16/1989    Years since quitting: 31.5   Smokeless tobacco: Never  Vaping Use   Vaping Use: Never used  Substance Use Topics   Alcohol use: Yes    Alcohol/week: 2.0 - 3.0 standard drinks    Types: 2 - 3 Cans of beer per week    Comment: WEEKENDS   Drug use: Not Currently    Review of Systems Constitutional: Negative for fever. Cardiovascular: Negative for chest pain. Respiratory: Negative for  shortness of breath. Gastrointestinal: Negative for abdominal pain Musculoskeletal: Left leg pain redness and swelling. Skin: Mild redness of the left leg. Neurological: Negative for headache All other ROS negative  ____________________________________________   PHYSICAL EXAM:  VITAL SIGNS: ED Triage Vitals [04/07/21 1223]  Enc Vitals Group     BP 137/90     Pulse Rate 98     Resp 18     Temp 98.3 F (36.8 C)     Temp Source Oral     SpO2 98 %     Weight 200 lb (90.7 kg)     Height 6' (1.829 m)     Head Circumference      Peak Flow      Pain Score 4     Pain Loc      Pain Edu?      Excl. in [place]?    Constitutional: Alert and oriented. Well appearing and in no distress. Eyes: Normal exam ENT      Head: Normocephalic and atraumatic.      Mouth/Throat: Mucous membranes are moist. Cardiovascular: Normal rate, regular rhythm.  Respiratory: Normal respiratory effort without tachypnea nor retractions. Breath sounds are clear Gastrointestinal: Soft and nontender. No distention.  Musculoskeletal: Patient with mild erythema of the left lower extremity with moderate swelling compared to the right lower extremity.  Mild calf tenderness to palpation. Neurologic:  Normal speech and language. No gross focal neurologic deficits Skin:  Skin is warm, dry and intact.  Psychiatric: Mood and affect are normal.  ____________________________________________    EKG  EKG viewed and interpreted by myself shows normal sinus rhythm at 90 bpm with a narrow QRS, left axis deviation, largely normal intervals with no concerning ST changes.  ____________________________________________    RADIOLOGY  Ultrasound consistent with extensive occlusive DVT throughout the left lower extremity.  ____________________________________________   INITIAL IMPRESSION / ASSESSMENT AND PLAN / ED COURSE  Pertinent labs & imaging results that were available during my care of the patient were reviewed by me  and considered in my medical decision making (see chart for details).   Patient presents to the emergency department for 3 days of left lower extremity discomfort now with erythema and mild swelling.  No history of DVT previously.  Patient's ultrasound is consistent with occlusive extensive DVT throughout the left lower extremity.  No chest pain or swelling.  Patient does not take anticoagulation for any reason.  No identified risk factors for DVT.  No long travel, surgeries, etc.  No weight loss or history of cancer.  Given the patient's extensive nonocclusive left lower extremity DVT we will discuss with vascular surgery for further recommendations and possible lysis or  thrombectomy.  I spoke to Dr. Delana Meyer of vascular surgery.  Given the extensive and occlusive nature of the clot he recommends admission on a heparin drip to medicine for thrombectomy.  Spoke to the patient who is agreeable to this plan of care.  CTA concerning for bilateral PEs but no signs of right heart strain.  Jospeh Mangel Carr was evaluated in Emergency Department on 04/07/2021 for the symptoms described in the history of present illness. He was evaluated in the context of the global COVID-19 pandemic, which necessitated consideration that the patient might be at risk for infection with the SARS-CoV-2 virus that causes COVID-19. Institutional protocols and algorithms that pertain to the evaluation of patients at risk for COVID-19 are in a state of rapid change based on information released by regulatory bodies including the CDC and federal and state organizations. These policies and algorithms were followed during the patient's care in the ED.  CRITICAL CARE Performed by: Harvest Dark   Total critical care time: 30 minutes  Critical care time was exclusive of separately billable procedures and treating other patients.  Critical care was necessary to treat or prevent imminent or life-threatening deterioration.  Critical  care was time spent personally by me on the following activities: development of treatment plan with patient and/or surrogate as well as nursing, discussions with consultants, evaluation of patient's response to treatment, examination of patient, obtaining history from patient or surrogate, ordering and performing treatments and interventions, ordering and review of laboratory studies, ordering and review of radiographic studies, pulse oximetry and re-evaluation of patient's condition.  ____________________________________________   FINAL CLINICAL IMPRESSION(S) / ED DIAGNOSES  Left lower extremity occlusive DVT   Harvest Dark, MD 04/07/21 1409    Harvest Dark, MD 04/07/21 [phone]

## 2021-04-07 NOTE — ED Notes (Signed)
Pt. Consumed 80% of his lunch.

## 2021-04-07 NOTE — ED Notes (Signed)
Pt denies CP; states slightly more winded than usual going up stairs lately. L leg swelling. L dorsalis pedis pulse 1+; foot cool; ankle and leg slightly warmer than R; cap refill <3 seconds. Pt able to move leg and foot appropriately. In NAD laying on stretcher calmly. Skin dry. Resp reg/unlabored.

## 2021-04-07 NOTE — Consult Note (Signed)
[facility] Young [facility] Consult Note  MRN : [id number]  Tyler Young is a 75 y.o. (1945/09/08) male who presents with chief complaint of  Chief Complaint  Patient presents with   Leg Swelling   History of Present Illness:  Tyler Young is a 75 year old male with medical history significant for nephrolithiasis, hypothyroidism, hyperlipidemia, hypertension, GERD, peripheral neuropathy, anal fissure, s/p left lateral internal sphincterotomy 02/11/2021 who presented to [facility] ED at the recommendation of his primary care provider due to left lower extremity edema, erythema and tenderness of 3-days duration and more painful on the day of presentation.    Associated with dyspnea with exertion.  No recent lengthy trips.  Sphincterectomy on 02/11/21 but stayed active.  No cough.  No hemoptysis.  No chest pain.  He endorses that his son had an unprovoked DVT.  He stays active by doing house chores, mowing the lawn, walking 2 miles a day, etc.    Work-up in the ED revealed large left lower extremity DVT extensive and occlusive, bilateral pulmonary embolism with no heart strain seen on CT scan.    Vascular surgery was consulted by Dr. Nevada Crane for possible endovascular intervention in the setting of DVT and PE.  Current Facility-Administered Medications  Medication Dose Route Frequency Provider Last Rate Last Admin   acetaminophen (TYLENOL) tablet 650 mg  650 mg Oral Q6H PRN Kayleen Memos, DO       [START ON 04/08/2021] amLODipine (NORVASC) tablet 2.5 mg  2.5 mg Oral Daily Hall, Carole N, DO       doxazosin (CARDURA) tablet 2 mg  2 mg Oral QPM Hall, Carole N, DO       gabapentin (NEURONTIN) capsule 200 mg  200 mg Oral QHS Hall, Carole N, DO       heparin ADULT infusion 100 units/mL (25000 units/256mL)  1,500 Units/hr Intravenous Continuous Kayleen Memos, DO 15 mL/hr at 04/07/21 1512 1,500 Units/hr at 04/07/21 1512   HYDROmorphone (DILAUDID) injection 0.5 mg  0.5 mg Intravenous Q4H PRN  Kayleen Memos, DO       [START ON 04/08/2021] icosapent Ethyl (VASCEPA) 1 g capsule 1 g  1 g Oral Daily Hall, Carole N, DO       insulin aspart (novoLOG) injection 0-5 Units  0-5 Units Subcutaneous QHS Hall, Carole N, DO       insulin aspart (novoLOG) injection 0-9 Units  0-9 Units Subcutaneous TID WC Hall, Carole N, DO       lactated ringers infusion   Intravenous Continuous Kayleen Memos, DO 30 mL/hr at 04/07/21 1522 New Bag at 04/07/21 1522   [START ON 04/08/2021] levothyroxine (SYNTHROID) tablet 125 mcg  125 mcg Oral QAC breakfast Irene Pap N, DO       melatonin tablet 5 mg  5 mg Oral QHS PRN Irene Pap N, DO       ondansetron (ZOFRAN) injection 4 mg  4 mg Intravenous Q6H PRN Irene Pap N, DO       oxyCODONE (Oxy IR/ROXICODONE) immediate release tablet 5 mg  5 mg Oral Q4H PRN Hall, Carole N, DO       [START ON 04/08/2021] pantoprazole (PROTONIX) EC tablet 40 mg  40 mg Oral Daily Hall, Carole N, DO       polyethylene glycol (MIRALAX / GLYCOLAX) packet 17 g  17 g Oral Daily PRN Irene Pap N, DO       pravastatin (PRAVACHOL) tablet 20 mg  20 mg Oral  QPM Hall, Carole N, DO       spironolactone (ALDACTONE) tablet 50 mg  50 mg Oral BID Kayleen Memos, DO       Current Outpatient Medications  Medication Sig Dispense Refill   amLODipine (NORVASC) 5 MG tablet Take 2.5 mg by mouth daily.     diphenhydrAMINE (SOMINEX) 25 MG tablet Take 25 mg by mouth at bedtime.     doxazosin (CARDURA) 2 MG tablet Take 2 mg by mouth every evening.     gabapentin (NEURONTIN) 100 MG capsule Take 200 mg by mouth at bedtime.     levothyroxine (SYNTHROID, LEVOTHROID) 125 MCG tablet Take 125 mcg by mouth daily before breakfast.     naproxen sodium (ALEVE) 220 MG tablet Take 220-440 mg by mouth daily as needed (pain).     omeprazole (PRILOSEC) 20 MG capsule Take 20 mg by mouth every morning.     pravastatin (PRAVACHOL) 20 MG tablet Take 20 mg by mouth every evening.     spironolactone (ALDACTONE) 50 MG tablet Take  50 mg by mouth 2 (two) times daily.     triamcinolone (NASACORT) 55 MCG/ACT AERO nasal inhaler Place 1-2 sprays into the nose daily as needed (allergies).     VASCEPA 0.5 g CAPS Take 0.5 g by mouth daily.     PRESCRIPTION MEDICATION Apply 1 application topically 2 (two) times daily. Nifedipine 0.3% and lidocaine 2%     Past Medical History:  Diagnosis Date   Anginal pain ([place])    AT REST. STRESS/ECHO 07/25/18   Cancer ([place])    SKIN    Chronic hypokalemia    SEEING DR [facility]   Dyspnea    DOE   GERD (gastroesophageal reflux disease)    History of kidney stones    Hypertension    Hypothyroidism    Neuropathy    FEET   Pre-diabetes    Past Surgical History:  Procedure Laterality Date   COLONOSCOPY     EYE SURGERY     MYRINGOTOMY WITH TUBE PLACEMENT Bilateral 09/19/2018   Procedure: MYRINGOTOMY WITH TUBE PLACEMENT;  Surgeon: Carloyn Manner, MD;  Location: [facility] ORS;  Service: ENT;  Laterality: Bilateral;   NASAL POLYP EXCISION     PAROTIDECTOMY Right    THYROIDECTOMY, PARTIAL     TONSILLECTOMY     Social History Social History   Tobacco Use   Smoking status: Former    Types: Cigarettes    Quit date: 09/16/1989    Years since quitting: 31.5   Smokeless tobacco: Never  Vaping Use   Vaping Use: Never used  Substance Use Topics   Alcohol use: Yes    Alcohol/week: 2.0 - 3.0 standard drinks    Types: 2 - 3 Cans of beer per week    Comment: WEEKENDS   Drug use: Not Currently   Family History: No family history on file. Denies family history of peripheral artery disease, venous disease or renal disease.  Allergies  Allergen Reactions   Doxycycline Other (See Comments)    Unsure of exact reaction type   Penicillins Other (See Comments)    Did it involve swelling of the face/tongue/throat, SOB, or low BP? Unknown Did it involve sudden or severe rash/hives, skin peeling, or any reaction on the inside of your mouth or nose? Unknown Did you need to seek medical attention at  a hospital or doctor's office? Yes When did it last happen? Childhood reaction at 75 years old       If all  above answers are "NO", may proceed with cephalosporin use.    REVIEW OF SYSTEMS (Negative unless checked)  Constitutional: [] Weight loss  [] Fever  [] Chills Cardiac: [] Chest pain   [] Chest pressure   [] Palpitations   [] Shortness of breath when laying flat   [] Shortness of breath at rest   [x] Shortness of breath with exertion. Vascular:  [] Pain in legs with walking   [] Pain in legs at rest   [] Pain in legs when laying flat   [] Claudication   [] Pain in feet when walking  [] Pain in feet at rest  [] Pain in feet when laying flat   [] History of DVT   [] Phlebitis   [x] Swelling in legs   [] Varicose veins   [] Non-healing ulcers Pulmonary:   [] Uses home oxygen   [] Productive cough   [] Hemoptysis   [] Wheeze  [] COPD   [] Asthma Neurologic:  [] Dizziness  [] Blackouts   [] Seizures   [] History of stroke   [] History of TIA  [] Aphasia   [] Temporary blindness   [] Dysphagia   [] Weakness or numbness in arms   [] Weakness or numbness in legs Musculoskeletal:  [] Arthritis   [] Joint swelling   [] Joint pain   [] Low back pain Hematologic:  [] Easy bruising  [] Easy bleeding   [] Hypercoagulable state   [] Anemic  [] Hepatitis Gastrointestinal:  [] Blood in stool   [] Vomiting blood  [] Gastroesophageal reflux/heartburn   [] Difficulty swallowing. Genitourinary:  [] Chronic kidney disease   [] Difficult urination  [] Frequent urination  [] Burning with urination   [] Blood in urine Skin:  [] Rashes   [] Ulcers   [] Wounds Psychological:  [] History of anxiety   []  History of major depression.  Physical Examination  Vitals:   04/07/21 1223 04/07/21 1427 04/07/21 1500  BP: 137/90 129/84 127/73  Pulse: 98 74 76  Resp: 18 17 19   Temp: 98.3 F (36.8 C)    TempSrc: Oral    SpO2: 98% 100% 100%  Weight: 90.7 kg    Height: 6' (1.829 m)     Body mass index is 27.12 kg/m. Gen:  WD/WN, NAD Head: [place]/AT, No temporalis wasting.  Prominent temp pulse not noted. Ear/Nose/Throat: Hearing grossly intact, nares w/o erythema or drainage, oropharynx w/o Erythema/Exudate Eyes: Sclera non-icteric, conjunctiva clear Neck: Trachea midline.  No JVD.  Pulmonary:  Good air movement, respirations not labored, equal bilaterally.  Cardiac: RRR, normal S1, S2. Vascular:  Vessel Right Left  Radial Palpable Palpable  Ulnar Palpable Palpable  Brachial Palpable Palpable  Carotid Palpable, without bruit Palpable, without bruit  Aorta Not palpable N/A  Femoral Palpable Palpable  Popliteal Palpable Palpable  PT Palpable Non-Palpable  DP Palpable Non-Palpable   Left lower extremity: Thigh soft.  Calf soft.  Hard to palpate pedal pulses due to edema however the extremity is warm to the toes.  Good capillary refill.  Warm.  There is no acute vascular compromise noted on exam at this time.  Water/sensory is intact.  Gastrointestinal: soft, non-tender/non-distended. No guarding/reflex.  Musculoskeletal: M/S 5/5 throughout.  Extremities without ischemic changes.  No deformity or atrophy. No edema. Neurologic: Sensation grossly intact in extremities.  Symmetrical.  Speech is fluent. Motor exam as listed above. Psychiatric: Judgment intact, Mood & affect appropriate for pt's clinical situation. Dermatologic: No rashes or ulcers noted.  No cellulitis or open wounds. Lymph : No Cervical, Axillary, or Inguinal lymphadenopathy.  CBC Lab Results  Component Value Date   WBC 12.7 (H) 04/07/2021   HGB 15.5 04/07/2021   HCT 43.5 04/07/2021   MCV 90.6 04/07/2021   PLT 179 04/07/2021  BMET    Component Value Date/Time   NA 134 (L) 04/07/2021 1230   K 4.5 04/07/2021 1230   CL 98 04/07/2021 1230   CO2 26 04/07/2021 1230   GLUCOSE 195 (H) 04/07/2021 1230   BUN 23 04/07/2021 1230   CREATININE 1.41 (H) 04/07/2021 1230   CALCIUM 9.4 04/07/2021 1230   GFRNONAA 53 (L) 04/07/2021 1230   Estimated Creatinine Clearance: 51.2 mL/min (A) (by C-G  formula based on SCr of 1.41 mg/dL (H)).  COAG Lab Results  Component Value Date   INR 1.1 04/07/2021   Radiology CT Angio Chest PE W and/or Wo Contrast  Result Date: 04/07/2021 CLINICAL DATA:  Short of breath with exertion.  Left leg swelling. EXAM: CT ANGIOGRAPHY CHEST WITH CONTRAST TECHNIQUE: Multidetector CT imaging of the chest was performed using the standard protocol during bolus administration of intravenous contrast. Multiplanar CT image reconstructions and MIPs were obtained to evaluate the vascular anatomy. CONTRAST:  66mL OMNIPAQUE IOHEXOL 350 MG/ML SOLN COMPARISON:  01/13/2002 plain film FINDINGS: Cardiovascular: The quality of this exam for evaluation of pulmonary embolism is sufficient. Acute pulmonary artery filling defects including within the distal right pulmonary artery on 41/4 and within lobar to segmental branches of the right lower lobe including on 51 through 55 of series 4. Smaller volume left-sided emboli including to the left lower lobe on 46/4. No evidence of right heart strain. Aortic atherosclerosis. Tortuous thoracic aorta. Mild cardiomegaly, without pericardial effusion. Lad coronary artery calcification. Mediastinum/Nodes: Partial thyroidectomy. No mediastinal or hilar adenopathy. Tiny hiatal hernia. Mild distal esophageal wall thickening. Lungs/Pleura: No pleural fluid. Left lower lobe calcified granuloma laterally. Areas of mild peripheral predominant airspace and ground-glass opacity involving the right upper lobe on 43/6, right middle lobe on 51/6, and right lower lobe on 73/6. Upper Abdomen: Nonspecific caudate lobe enlargement. Normal imaged portions of the spleen, pancreas, gallbladder, adrenal glands, left kidney Musculoskeletal: Marked left and moderate right sided gynecomastia. 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 Review of the MIP images confirms the above findings. IMPRESSION: 1. Bilateral pulmonary emboli, without evidence of right heart strain. 2. Areas of peripheral  predominant airspace and ground-glass opacity, favored to represent infarcts. 3. Tiny hiatal hernia. Esophageal wall thickening suggests esophagitis. 4. Coronary artery atherosclerosis. Aortic Atherosclerosis (ICD10-I70.0). 5. Bilateral gynecomastia. These results were called by telephone at the time of interpretation on 04/07/2021 at 3:05 pm to provider dr. Kerman Passey, who verbally acknowledged these results. Electronically Signed   By: Abigail Miyamoto M.D.   On: 04/07/2021 15:07   US Venous Img Lower Unilateral Left  Result Date: 04/07/2021 CLINICAL DATA:  Leg swelling EXAM: Left LOWER EXTREMITY VENOUS DOPPLER ULTRASOUND TECHNIQUE: Gray-scale sonography with graded compression, as well as color Doppler and duplex ultrasound were performed to evaluate the lower extremity deep venous systems from the level of the common femoral vein and including the common femoral, femoral, profunda femoral, popliteal and calf veins including the posterior tibial, peroneal and gastrocnemius veins when visible. The superficial great saphenous vein was also interrogated. Spectral Doppler was utilized to evaluate flow at rest and with distal augmentation maneuvers in the common femoral, femoral and popliteal veins. COMPARISON:  None. FINDINGS: Contralateral Common Femoral Vein: Respiratory phasicity is normal and symmetric with the symptomatic side. No evidence of thrombus. Normal compressibility. Common Femoral Vein: There is occlusive thrombus in the left common femoral vein. Saphenofemoral Junction: There is occlusive thrombus at the saphenofemoral junction. Profunda Femoral Vein: There is occlusive thrombus in the profunda femoral vein. Femoral Vein: There is  occlusive thrombus in the femoral vein. Popliteal Vein: There is occlusive thrombus in the popliteal vein. Calf Veins: There is occlusive thrombus in the imaged calf veins. Superficial Great Saphenous Vein: There is apparent Doppler flow within the imaged portions of the  great saphenous vein. Venous Reflux:  None. Other Findings:  None. IMPRESSION: Extensive occlusive thrombus throughout the left lower extremity from the calf to the groin. These results were called by telephone at the time of interpretation on 04/07/2021 at 1:56 Pm to provider Harvest Dark, who verbally acknowledged these results. Electronically Signed   By: Valetta Mole M.D.   On: 04/07/2021 14:11    Assessment/Plan July Nickson is a 75 year old male with medical history significant for nephrolithiasis, hypothyroidism, hyperlipidemia, hypertension, GERD, peripheral neuropathy, anal fissure, s/p left lateral internal sphincterotomy 02/11/2021 who presented to [facility] ED at the recommendation of his primary care provider due to left lower extremity edema, erythema and tenderness of 3-days duration and more painful on the day of presentation found to have extensive left lower extremity DVT and PE.  1.  Extensive left lower extremity DVT: Patient presents with 3 days of swelling, discomfort and erythema to the left lower extremity.  Patient was found to have extensive left lower extremity DVT:  "Extensive occlusive thrombus throughout the left lower extremity from the calf to the groin".  In the setting of an extensive proximal DVT would recommend the patient undergo a left lower extremity peripheral venous thrombectomy / thrombolysis in an attempt to lessen the clot burden, decrease the risk of worsening pulmonary embolism and postphlebitic symptoms.  Procedure, risks and benefits were explained to the patient.  All questions were answered.  The patient wishes to proceed.  We will plan on this tomorrow with Dr. Delana Meyer.  2.  Pulmonary Embolism: Patient notes mild shortness of breath with exertion.  Denies any shortness of breath or chest pain at rest.  Normal oxygen saturations on room air.  CTA of the chest was notable for:  1. Bilateral pulmonary emboli, without evidence of right heart strain. 2.  Areas of peripheral predominant airspace and ground-glass opacity, favored to represent infarcts.  At this time, the patient is relatively asymptomatic in regard to the pulmonary embolus.  In the absence of symptoms, and right heart strain would not move forward with a pulmonary thrombectomy / thrombolysis at this time.  We do have a 2-week window to reassess if the patient were to become more symptomatic.  The patient expresses understanding.  3.  Anticoagulation: Patient has DVT and PE. Guidelines suggest anticoagulation for approximately 1 year This was discussed in detail with the patient. He expresses understanding He is currently on heparin and will be transition to p.o. oral anticoagulation after the procedure  Discussed with Dr. Francene Castle, PA-C 04/07/2021 3:35 PM  This note was created with Dragon medical transcription system.  Any error is purely unintentional.

## 2021-04-07 NOTE — H&P (Addendum)
History and Physical  Tyler Young KDT:[id number] DOB: Jun 02, 1946 DOA: 04/07/2021  Referring physician: Dr. Kerman Passey, [place]  PCP: Tracie Harrier, MD  Outpatient Specialists: Endocrinology, cardiology. Patient coming from: Home  Chief Complaint: Worsening left lower extremity swelling and pain.  HPI: Tyler Young is a 75 y.o. male with medical history significant for nephrolithiasis, hypothyroidism, hyperlipidemia, hypertension, GERD, peripheral neuropathy, anal fissure, status post left lateral internal sphincterotomy 02/11/2021 who presented to [facility] ED at the recommendation of his primary care provider due to left lower extremity edema, erythema and tenderness of 3-days duration and more painful on the day of presentation.  Associated with dyspnea with exertion.  No recent lengthy trips.  Sphincterectomy on 02/11/21 but stayed active.  No cough.  No hemoptysis.  No chest pain.  He endorses that his son had an unprovoked DVT.  He stays active by doing house chores, mowing the lawn, walking 2 miles a day, etc.  Work-up in the ED revealed large left lower extremity DVT extensive and occlusive, bilateral pulmonary embolism with no heart strain seen on CT scan.  Vascular surgery was consulted by EDP, recommended heparin drip and possible thrombectomy on 04/08/2021.  TR[facility], hospitalist team, was asked to admit.  ED Course: Temperature 98.3.  BP 129/84, pulse 74, respiratory 17, O2 saturation 100% on room air.  Lab studies remarkable for serum sodium 134, glucose 195, BUN 23, creatinine 1.41 with GFR 53.  Total bilirubin 1.3.  BNP 10.8, negative.  Troponin 3, negative.  WBC 12.7.  Left lower extremity Doppler ultrasound revealed extensive occlusive thrombus throughout the left lower extremity from the calf to the groin.  Review of Systems: Review of systems as noted in the HPI. All other systems reviewed and are negative.   Past Medical History:  Diagnosis Date   Anginal pain ([place])    AT REST.  STRESS/ECHO 07/25/18   Cancer ([place])    SKIN    Chronic hypokalemia    SEEING DR [facility]   Dyspnea    DOE   GERD (gastroesophageal reflux disease)    History of kidney stones    Hypertension    Hypothyroidism    Neuropathy    FEET   Pre-diabetes    Past Surgical History:  Procedure Laterality Date   COLONOSCOPY     EYE SURGERY     MYRINGOTOMY WITH TUBE PLACEMENT Bilateral 09/19/2018   Procedure: MYRINGOTOMY WITH TUBE PLACEMENT;  Surgeon: Carloyn Manner, MD;  Location: [facility] ORS;  Service: ENT;  Laterality: Bilateral;   NASAL POLYP EXCISION     PAROTIDECTOMY Right    THYROIDECTOMY, PARTIAL     TONSILLECTOMY      Social History:  reports that he quit smoking about 31 years ago. His smoking use included cigarettes. He has never used smokeless tobacco. He reports current alcohol use of about 2.0 - 3.0 standard drinks per week. He reports that he does not currently use drugs.   Allergies  Allergen Reactions   Doxycycline Other (See Comments)    Unsure of exact reaction type   Penicillins Other (See Comments)    Did it involve swelling of the face/tongue/throat, SOB, or low BP? Unknown Did it involve sudden or severe rash/hives, skin peeling, or any reaction on the inside of your mouth or nose? Unknown Did you need to seek medical attention at a hospital or doctor's office? Yes When did it last happen? Childhood reaction at 75 years old       If all above answers are "NO",  may proceed with cephalosporin use.    Family history: Son with history of DVT. Father history of brain cancer. Mother with history of COPD  Prior to Admission medications   Medication Sig Start Date End Date Taking? Authorizing Provider  amLODipine (NORVASC) 5 MG tablet Take 5 mg by mouth daily. 10/21/19   [provider]  diphenhydrAMINE (SOMINEX) 25 MG tablet Take 25 mg by mouth at bedtime.    [provider]  doxazosin (CARDURA) 2 MG tablet Take 2 mg by mouth every evening. 07/11/18    [provider]  gabapentin (NEURONTIN) 100 MG capsule Take 200 mg by mouth at bedtime. 07/24/18   [provider]  levothyroxine (SYNTHROID, LEVOTHROID) 125 MCG tablet Take 125 mcg by mouth daily before breakfast.    [provider]  naproxen sodium (ALEVE) 220 MG tablet Take 220-440 mg by mouth daily as needed (pain).    [provider]  pravastatin (PRAVACHOL) 20 MG tablet Take 20 mg by mouth every evening. 07/19/18   [provider]  PRESCRIPTION MEDICATION Apply 1 application topically 2 (two) times daily. Nifedipine 0.3% and lidocaine 2%    [provider]  spironolactone (ALDACTONE) 50 MG tablet Take 50 mg by mouth 2 (two) times daily.    [provider]  triamcinolone (NASACORT) 55 MCG/ACT AERO nasal inhaler Place 1-2 sprays into the nose daily as needed (allergies).    [provider]  VASCEPA 0.5 g CAPS Take 0.5 g by mouth daily. 11/28/19   [provider]    Physical Exam: BP 137/90 (BP Location: Left Arm)   Pulse 98   Temp 98.3 F (36.8 C) (Oral)   Resp 18   Ht 6' (1.829 m)   Wt 90.7 kg   SpO2 98%   BMI 27.12 kg/m   General: 75 y.o. year-old male well developed well nourished in no acute distress.  Alert and oriented x3. Cardiovascular: Regular rate and rhythm with no rubs or gallops.  No thyromegaly or JVD noted.  Left lower extremity edema.  Respiratory: Clear to auscultation with no wheezes or rales. Good inspiratory effort. Abdomen: Soft nontender nondistended with normal bowel sounds x4 quadrants. Muskuloskeletal: No cyanosis or clubbing.  Left lower extremity edema. Neuro: CN II-XII intact, strength, sensation, reflexes Skin: No ulcerative lesions noted or rashes Psychiatry: Judgement and insight appear normal. Mood is appropriate for condition and setting          Labs on Admission:  Basic Metabolic Panel: Recent Labs  Lab 04/07/21 1230  NA 134*  K 4.5  CL 98  CO2 26  GLUCOSE  195*  BUN 23  CREATININE 1.41*  CALCIUM 9.4   Liver Function Tests: Recent Labs  Lab 04/07/21 1230  AST 19  ALT 22  ALKPHOS 70  BILITOT 1.3*  PROT 7.6  ALBUMIN 4.4   No results for input(s): LIPASE, AMYLASE in the last 168 hours. No results for input(s): AMMONIA in the last 168 hours. CBC: Recent Labs  Lab 04/07/21 1230  WBC 12.7*  NEUTROABS 10.0*  HGB 15.5  HCT 43.5  MCV 90.6  PLT 179   Cardiac Enzymes: No results for input(s): CKTOTAL, CKMB, CKMBINDEX, TROPONINI in the last 168 hours.  BNP (last 3 results) Recent Labs    04/07/21 1230  BNP 10.8    ProBNP (last 3 results) No results for input(s): PROBNP in the last 8760 hours.  CBG: No results for input(s): GLUCAP in the last 168 hours.  Radiological Exams on  Admission: US Venous Img Lower Unilateral Left  Result Date: 04/07/2021 CLINICAL DATA:  Leg swelling EXAM: Left LOWER EXTREMITY VENOUS DOPPLER ULTRASOUND TECHNIQUE: Gray-scale sonography with graded compression, as well as color Doppler and duplex ultrasound were performed to evaluate the lower extremity deep venous systems from the level of the common femoral vein and including the common femoral, femoral, profunda femoral, popliteal and calf veins including the posterior tibial, peroneal and gastrocnemius veins when visible. The superficial great saphenous vein was also interrogated. Spectral Doppler was utilized to evaluate flow at rest and with distal augmentation maneuvers in the common femoral, femoral and popliteal veins. COMPARISON:  None. FINDINGS: Contralateral Common Femoral Vein: Respiratory phasicity is normal and symmetric with the symptomatic side. No evidence of thrombus. Normal compressibility. Common Femoral Vein: There is occlusive thrombus in the left common femoral vein. Saphenofemoral Junction: There is occlusive thrombus at the saphenofemoral junction. Profunda Femoral Vein: There is occlusive thrombus in the profunda femoral vein. Femoral  Vein: There is occlusive thrombus in the femoral vein. Popliteal Vein: There is occlusive thrombus in the popliteal vein. Calf Veins: There is occlusive thrombus in the imaged calf veins. Superficial Great Saphenous Vein: There is apparent Doppler flow within the imaged portions of the great saphenous vein. Venous Reflux:  None. Other Findings:  None. IMPRESSION: Extensive occlusive thrombus throughout the left lower extremity from the calf to the groin. These results were called by telephone at the time of interpretation on 04/07/2021 at 1:56 Pm to provider Harvest Dark, who verbally acknowledged these results. Electronically Signed   By: Valetta Mole M.D.   On: 04/07/2021 14:11    EKG: I independently viewed the EKG done and my findings are as followed: Sinus rhythm rate of 90.  Nonspecific ST-T changes.  QTc 430.  Assessment/Plan Present on Admission:  DVT (deep venous thrombosis) ([place])  Active Problems:   DVT (deep venous thrombosis) ([facility])  Newly diagnosed extensive nonocclusive acute left lower extremity DVT, seen on Doppler ultrasound. Family history, his son had an unprovoked DVT. Vascular surgery consulted by EDP and recommended heparin drip and thrombectomy on 04/08/2021. Heparin drip, dosed by pharmacy, appreciate assistance. Pain control as needed along with bowel regimen as needed CTA chest ordered by EDP, revealed bilateral pulmonary embolism with no heart strain seen by CT scan. He will benefit from hypercoagulability work-up and hematology follow-up outpatient.  Newly diagnosed bilateral pulmonary embolism, no heart strain seen on CT scan. Troponin negative, 3.  BNP normal, 10.8. Continue heparin drip Obtain 2D echo Currently not hypoxic, O2 saturation 100% on room air. Monitor O2 saturation and maintain above 92%.  AKI suspect prerenal in the setting of dehydration. Baseline creatinine appears to be 0.9 in 2020 (from care everywhere) Presented with creatinine of 1.41  with GFR 33. Avoid nephrotoxic agents, dehydration and hypotension. Gentle IV fluid hydration, LR at 30 cc/h x 2 days. Monitor urine output Repeat BMP in the morning.  Prediabetes with hyperglycemia Last hemoglobin A1c 5.7, 3 days ago (04/01/2021) from care everywhere. Presents with serum glucose of 195 Start SSI scale.  Leukocytosis, suspect reactive in the setting of acute DVT WBC 12.7 Follow UA Monitor for now repeat CBC in the morning.  Elevated total bilirubin Total bilirubin 1.3. Alkaline phosphatase, AST ALT normal. Nonspecific Monitor for now  Hypothyroidism Resume home levothyroxine  GERD Resume home PPI  Peripheral neuropathy Resume home gabapentin  Essential hypertension BP is at goal Resume home oral antihypertensives  BPH Resume home doxazosin  Hyperlipidemia Resume home  regimen. Pravastatin, Vascepa       DVT prophylaxis: Heparin drip  Code Status: Full code  Family Communication: None at bedside  Disposition Plan: Admit to stepdown unit  Consults called: Vascular surgery consulted by EDP  Admission status: Inpatient status.  Patient will require at least 2 midnights for further evaluation and treatment of present condition.   Status is: Inpatient    Dispo:  Patient From:  Home  Planned Disposition:  Home  Medically stable for discharge:  No          Kayleen Memos MD [facility] Pager [phone]  If 7PM-7AM, please contact night-coverage [email or web address] Password [email or web address]  04/07/2021, 2:26 PM

## 2021-04-07 NOTE — ED Triage Notes (Signed)
Pt here with left leg swelling that started Tuesday. PT states that the left leg is warmer and more painful than the right leg. PT A&O x4 and ambulatory.

## 2021-04-07 NOTE — Consult Note (Signed)
ANTICOAGULATION CONSULT NOTE - Initial Consult  Pharmacy Consult for Heparin infusion Indication: DVT  Allergies  Allergen Reactions   Doxycycline Other (See Comments)    Unsure of exact reaction type   Penicillins Other (See Comments)    Did it involve swelling of the face/tongue/throat, SOB, or low BP? Unknown Did it involve sudden or severe rash/hives, skin peeling, or any reaction on the inside of your mouth or nose? Unknown Did you need to seek medical attention at a hospital or doctor's office? Yes When did it last happen? Childhood reaction at 75 years old       If all above answers are "NO", may proceed with cephalosporin use.     Patient Measurements: Height: 6' (182.9 cm) Weight: 90.7 kg (200 lb) IBW/kg (Calculated) : 77.6 Heparin Dosing Weight: 90.7kg  Vital Signs: Temp: 98.3 F (36.8 C) (09/29 1223) Temp Source: Oral (09/29 1223) BP: 137/90 (09/29 1223) Pulse Rate: 98 (09/29 1223)  Labs: Recent Labs    04/07/21 1230  HGB 15.5  HCT 43.5  PLT 179  CREATININE 1.41*  TROPONINIHS 3    Estimated Creatinine Clearance: 51.2 mL/min (A) (by C-G formula based on SCr of 1.41 mg/dL (H)).   Medical History: Past Medical History:  Diagnosis Date   Anginal pain ([place])    AT REST. STRESS/ECHO 07/25/18   Cancer ([place])    SKIN    Chronic hypokalemia    SEEING DR [facility]   Dyspnea    DOE   GERD (gastroesophageal reflux disease)    History of kidney stones    Hypertension    Hypothyroidism    Neuropathy    FEET   Pre-diabetes     Assessment: PMH relevant for angina, HTN, pre-diabetes, cancer, hypothyroidism, and neuropathy. Pharmacy consulted to manage heparin infusion for VTE treatment. No history of previous DVE/PE ans no AC prior to admission.  Baseline labs: Plt 179 H/H 15.5/43.5 Scr 1.41 INR - 1.1 aPTT - 32  Goal of Therapy:  Heparin level 0.3-0.7 units/ml Monitor platelets by anticoagulation protocol: Yes   Plan:  Give 6000 units bolus x 1 Start  heparin infusion at 1500 units/hr Check anti-Xa level in 6 hours and daily while on heparin Continue to monitor H&H and platelets  Ja Pistole Rodriguez-Guzman PharmD, BCPS 04/07/2021 2:47 PM

## 2021-04-07 NOTE — Consult Note (Signed)
ANTICOAGULATION CONSULT NOTE - Initial Consult  Pharmacy Consult for Heparin infusion Indication: DVT  Allergies  Allergen Reactions   Doxycycline Other (See Comments)    Unsure of exact reaction type   Penicillins Other (See Comments)    Did it involve swelling of the face/tongue/throat, SOB, or low BP? Unknown Did it involve sudden or severe rash/hives, skin peeling, or any reaction on the inside of your mouth or nose? Unknown Did you need to seek medical attention at a hospital or doctor's office? Yes When did it last happen? Childhood reaction at 75 years old       If all above answers are "NO", may proceed with cephalosporin use.     Patient Measurements: Height: 6' (182.9 cm) Weight: 90.7 kg (200 lb) IBW/kg (Calculated) : 77.6 Heparin Dosing Weight: 90.7kg  Vital Signs: Temp: 98 F (36.7 C) (09/29 1937) Temp Source: Oral (09/29 1937) BP: 130/75 (09/29 1937) Pulse Rate: 66 (09/29 1937)  Labs: Recent Labs    04/07/21 1230 04/07/21 1429 04/07/21 2119  HGB 15.5  --   --   HCT 43.5  --   --   PLT 179  --   --   APTT 32  --   --   LABPROT 13.8  --   --   INR 1.1  --   --   HEPARINUNFRC  --   --  0.82*  CREATININE 1.41*  --   --   TROPONINIHS 3 2  --      Estimated Creatinine Clearance: 51.2 mL/min (A) (by C-G formula based on SCr of 1.41 mg/dL (H)).   Medical History: Past Medical History:  Diagnosis Date   Anginal pain ([place])    AT REST. STRESS/ECHO 07/25/18   Cancer ([place])    SKIN    Chronic hypokalemia    SEEING DR [facility]   Dyspnea    DOE   GERD (gastroesophageal reflux disease)    History of kidney stones    Hypertension    Hypothyroidism    Neuropathy    FEET   Pre-diabetes     Assessment: PMH relevant for angina, HTN, pre-diabetes, cancer, hypothyroidism, and neuropathy. Pharmacy consulted to manage heparin infusion for VTE treatment. No history of previous DVE/PE ans no AC prior to admission.  Baseline labs: Plt 179 H/H 15.5/43.5 Scr  1.41 INR - 1.1 aPTT - 32  Goal of Therapy:  Heparin level 0.3-0.7 units/ml Monitor platelets by anticoagulation protocol: Yes   Plan:  9/29:  HL @ 2119  = 0.82 Will decrease drip rate to 1400 units/hr and recheck HL 8 hrs after rate change.   04/07/2021 10:57 PM

## 2021-04-08 ENCOUNTER — Inpatient Hospital Stay (HOSPITAL_COMMUNITY)
Admit: 2021-04-08 | Discharge: 2021-04-08 | Disposition: A | Discharge status: Home / Self Care | Payer: Medicare Other | Attending: Internal Medicine | Admitting: Internal Medicine

## 2021-04-08 ENCOUNTER — Encounter
Admission: EM | Disposition: A | Discharge status: Home / Self Care | Payer: Self-pay | Source: Home / Self Care | Attending: Internal Medicine

## 2021-04-08 ENCOUNTER — Encounter: Payer: Self-pay | Admitting: Internal Medicine

## 2021-04-08 ENCOUNTER — Other Ambulatory Visit: Payer: Self-pay

## 2021-04-08 DIAGNOSIS — E039 Hypothyroidism, unspecified: Secondary | ICD-10-CM

## 2021-04-08 DIAGNOSIS — I2699 Other pulmonary embolism without acute cor pulmonale: Secondary | ICD-10-CM

## 2021-04-08 DIAGNOSIS — N179 Acute kidney failure, unspecified: Secondary | ICD-10-CM

## 2021-04-08 DIAGNOSIS — I2609 Other pulmonary embolism with acute cor pulmonale: Secondary | ICD-10-CM

## 2021-04-08 DIAGNOSIS — E785 Hyperlipidemia, unspecified: Secondary | ICD-10-CM

## 2021-04-08 DIAGNOSIS — I10 Essential (primary) hypertension: Secondary | ICD-10-CM

## 2021-04-08 DIAGNOSIS — I2694 Multiple subsegmental pulmonary emboli without acute cor pulmonale: Secondary | ICD-10-CM

## 2021-04-08 DIAGNOSIS — N62 Hypertrophy of breast: Secondary | ICD-10-CM

## 2021-04-08 DIAGNOSIS — I82432 Acute embolism and thrombosis of left popliteal vein: Secondary | ICD-10-CM

## 2021-04-08 DIAGNOSIS — I82412 Acute embolism and thrombosis of left femoral vein: Secondary | ICD-10-CM

## 2021-04-08 HISTORY — PX: PERIPHERAL VASCULAR THROMBECTOMY: CATH118306

## 2021-04-08 LAB — CBC
HCT: 39.5 % (ref 39.0–52.0)
Hemoglobin: 13.9 g/dL (ref 13.0–17.0)
MCH: 31.7 pg (ref 26.0–34.0)
MCHC: 35.2 g/dL (ref 30.0–36.0)
MCV: 90 fL (ref 80.0–100.0)
Platelets: 158 10*3/uL (ref 150–400)
RBC: 4.39 MIL/uL (ref 4.22–5.81)
RDW: 13 % (ref 11.5–15.5)
WBC: 8.6 10*3/uL (ref 4.0–10.5)
nRBC: 0 % (ref 0.0–0.2)

## 2021-04-08 LAB — GLUCOSE, CAPILLARY
Glucose-Capillary: 107 mg/dL — ABNORMAL HIGH (ref 70–99)
Glucose-Capillary: 161 mg/dL — ABNORMAL HIGH (ref 70–99)

## 2021-04-08 LAB — ECHOCARDIOGRAM COMPLETE
AR max vel: 2.17 cm2
AV Area VTI: 2.58 cm2
AV Area mean vel: 2.32 cm2
AV Mean grad: 4 mmHg
AV Peak grad: 7 mmHg
Ao pk vel: 1.33 m/s
Area-P 1/2: 2.47 cm2
Height: 72 in
S' Lateral: 2.9 cm
Weight: 3200 oz

## 2021-04-08 LAB — CBG MONITORING, ED
Glucose-Capillary: 140 mg/dL — ABNORMAL HIGH (ref 70–99)
Glucose-Capillary: 124 mg/dL — ABNORMAL HIGH (ref 70–99)

## 2021-04-08 LAB — COMPREHENSIVE METABOLIC PANEL
ALT: 18 U/L (ref 0–44)
AST: 17 U/L (ref 15–41)
Albumin: 3.6 g/dL (ref 3.5–5.0)
Alkaline Phosphatase: 51 U/L (ref 38–126)
Anion gap: 8 (ref 5–15)
BUN: 17 mg/dL (ref 8–23)
CO2: 25 mmol/L (ref 22–32)
Calcium: 8.3 mg/dL — ABNORMAL LOW (ref 8.9–10.3)
Chloride: 103 mmol/L (ref 98–111)
Creatinine, Ser: 1.07 mg/dL (ref 0.61–1.24)
GFR, Estimated: >60 mL/min (ref >60)
Glucose, Bld: 135 mg/dL — ABNORMAL HIGH (ref 70–99)
Potassium: 4 mmol/L (ref 3.5–5.1)
Sodium: 136 mmol/L (ref 135–145)
Total Bilirubin: 1 mg/dL (ref 0.3–1.2)
Total Protein: 6.1 g/dL — ABNORMAL LOW (ref 6.5–8.1)

## 2021-04-08 LAB — HEPARIN LEVEL (UNFRACTIONATED)
Heparin Unfractionated: >1.1 IU/mL — ABNORMAL HIGH (ref 0.30–0.70)
Heparin Unfractionated: >1.1 IU/mL — ABNORMAL HIGH (ref 0.30–0.70)

## 2021-04-08 SURGERY — PERIPHERAL VASCULAR THROMBECTOMY
Anesthesia: Moderate Sedation | Laterality: Left

## 2021-04-08 MED ORDER — FENTANYL CITRATE (PF) 100 MCG/2ML IJ SOLN
INTRAMUSCULAR | Status: DC | PRN
  Administered 2021-04-08: 25 ug via INTRAVENOUS
  Administered 2021-04-08: 50 ug via INTRAVENOUS
  Administered 2021-04-08: 25 ug via INTRAVENOUS

## 2021-04-08 MED ORDER — HYDROMORPHONE HCL 1 MG/ML IJ SOLN: 1.0000 mg | Freq: Once | INTRAMUSCULAR | Status: DC | PRN

## 2021-04-08 MED ORDER — FENTANYL CITRATE PF 50 MCG/ML IJ SOSY
PREFILLED_SYRINGE | INTRAMUSCULAR | Status: AC
  Filled 2021-04-08: qty 1

## 2021-04-08 MED ORDER — IODIXANOL 320 MG/ML IV SOLN
INTRAVENOUS | Status: DC | PRN
  Administered 2021-04-08 (×2): 50 mL via INTRAVENOUS

## 2021-04-08 MED ORDER — CLINDAMYCIN PHOSPHATE 300 MG/50ML IV SOLN
INTRAVENOUS | Status: DC | PRN
  Administered 2021-04-08: 300 mg via INTRAVENOUS

## 2021-04-08 MED ORDER — HEPARIN (PORCINE) 25000 UT/250ML-% IV SOLN
1100.0000 [IU]/h | INTRAVENOUS | Status: DC
  Administered 2021-04-08: 1100 [IU]/h via INTRAVENOUS

## 2021-04-08 MED ORDER — MIDAZOLAM HCL 2 MG/ML PO SYRP: 8.0000 mg | ORAL_SOLUTION | Freq: Once | ORAL | Status: DC | PRN

## 2021-04-08 MED ORDER — SODIUM CHLORIDE 0.9 % IV SOLN: INTRAVENOUS | Status: DC

## 2021-04-08 MED ORDER — MIDAZOLAM HCL 2 MG/2ML IJ SOLN
INTRAMUSCULAR | Status: DC | PRN
  Administered 2021-04-08: 2 mg via INTRAVENOUS
  Administered 2021-04-08 (×2): 1 mg via INTRAVENOUS

## 2021-04-08 MED ORDER — HEPARIN SODIUM (PORCINE) 1000 UNIT/ML IJ SOLN
INTRAMUSCULAR | Status: DC | PRN
  Administered 2021-04-08: 4000 [IU] via INTRAVENOUS

## 2021-04-08 MED ORDER — ALTEPLASE 2 MG IJ SOLR
INTRAMUSCULAR | Status: DC | PRN
  Administered 2021-04-08 (×2): 10 mg

## 2021-04-08 MED ORDER — HEPARIN SODIUM (PORCINE) 1000 UNIT/ML IJ SOLN
INTRAMUSCULAR | Status: AC
  Filled 2021-04-08: qty 1

## 2021-04-08 MED ORDER — CLINDAMYCIN PHOSPHATE 300 MG/50ML IV SOLN
INTRAVENOUS | Status: AC
  Filled 2021-04-08: qty 50

## 2021-04-08 MED ORDER — ONDANSETRON HCL 4 MG/2ML IJ SOLN: 4.0000 mg | Freq: Four times a day (QID) | INTRAMUSCULAR | Status: DC | PRN

## 2021-04-08 MED ORDER — MIDAZOLAM HCL 5 MG/5ML IJ SOLN
INTRAMUSCULAR | Status: AC
  Filled 2021-04-08: qty 5

## 2021-04-08 MED ORDER — HEPARIN (PORCINE) 25000 UT/250ML-% IV SOLN
1450.0000 [IU]/h | INTRAVENOUS | Status: DC
  Administered 2021-04-08: 1250 [IU]/h via INTRAVENOUS
  Filled 2021-04-08: qty 250

## 2021-04-08 MED ORDER — METHYLPREDNISOLONE SODIUM SUCC 125 MG IJ SOLR: 125.0000 mg | Freq: Once | INTRAMUSCULAR | Status: DC | PRN

## 2021-04-08 MED ORDER — FAMOTIDINE 20 MG PO TABS: 40.0000 mg | ORAL_TABLET | Freq: Once | ORAL | Status: DC | PRN

## 2021-04-08 MED ORDER — ALTEPLASE 2 MG IJ SOLR
INTRAMUSCULAR | Status: AC
  Filled 2021-04-08: qty 10

## 2021-04-08 MED ORDER — CLINDAMYCIN PHOSPHATE 300 MG/50ML IV SOLN: 300.0000 mg | Freq: Once | INTRAVENOUS | Status: DC

## 2021-04-08 MED ORDER — DIPHENHYDRAMINE HCL 50 MG/ML IJ SOLN: 50.0000 mg | Freq: Once | INTRAMUSCULAR | Status: DC | PRN

## 2021-04-08 SURGICAL SUPPLY — 21 items
BALLN DORADO 8X100X80 (BALLOONS)
BALLOON DORADO 8X100X80 (BALLOONS) IMPLANT
CANISTER PENUMBRA ENGINE (MISCELLANEOUS) ×1 IMPLANT
CANNULA 5F STIFF (CANNULA) ×2 IMPLANT
CATH BEACON 5 .035 65 KMP TIP (CATHETERS) ×1 IMPLANT
CATH INDIGO 12XTORQ 100 (CATHETERS) ×1 IMPLANT
CATH INDIGO SEP 12 (CATHETERS) ×1 IMPLANT
CATH INFUS 90CMX20CM (CATHETERS) ×1 IMPLANT
COVER PROBE U/S 5X48 (MISCELLANEOUS) ×1 IMPLANT
DEVICE SAFEGUARD 24CM (GAUZE/BANDAGES/DRESSINGS) ×1 IMPLANT
DEVICE TORQUE (MISCELLANEOUS) ×1 IMPLANT
DRAPE FEMORAL ANGIO W/ POUCH (DRAPES) ×1 IMPLANT
GLIDEWIRE STIFF .35X180X3 HYDR (WIRE) ×1 IMPLANT
GUIDEWIRE ADV .018X180CM (WIRE) ×1 IMPLANT
KIT ENCORE 26 ADVANTAGE (KITS) IMPLANT
KIT FEMORAL DEL DENALI (Miscellaneous) ×1 IMPLANT
PACK ANGIOGRAPHY (CUSTOM PROCEDURE TRAY) ×2 IMPLANT
SHEATH PINNACLE 11FRX10 (SHEATH) ×1 IMPLANT
WIRE GUIDERIGHT .035X150 (WIRE) ×4 IMPLANT
WIRE MAGIC TORQUE 260C (WIRE) ×2 IMPLANT
WIRE NITINOL .018 (WIRE) ×1 IMPLANT

## 2021-04-08 NOTE — Plan of Care (Signed)

## 2021-04-08 NOTE — Op Note (Signed)
[facility]                                                                               OPERATIVE NOTE    PRE-OPERATIVE DIAGNOSIS: Symptomatic left leg DVT; bilateral pulmonary embolism.  POST-OPERATIVE DIAGNOSIS: Same  PROCEDURE: 1.   Ultrasound guidance for vascular access to the right common femoral vein and left popliteal vein 2.   Catheter placement into the inferior vena cava for placement of IVC filter 3.   Inferior venacavogram 4.   Placement of a Denali IVC filter infrarenal 5.    Introduction catheter into venous system second order catheter placement left popliteal approach 6.    Infusion thrombolysis with 10 mg of TPA 7.    Mechanical thrombectomy of the left popliteal, SFV and common femoral vein using the penumbra CAT 12 lightening catheter  SURGEON: Hortencia Pilar  ASSISTANT(S): None  ANESTHESIA: Conscious sedation was administered by the interventional radiology RN under my direct supervision. IV Versed plus fentanyl were utilized. Continuous ECG, pulse oximetry and blood pressure was monitored throughout the entire procedure.  Conscious sedation was administered for a total of 1 hour 37 minutes and 29 seconds.  ESTIMATED BLOOD LOSS: minimal  Contrast: 50 cc  Fluoroscopy time: 16.8 minutes  FINDING(S): 1.  Patent IVC; thrombus within the left popliteal, superficial femoral vein and common femoral vein  SPECIMEN(S): Thrombus image recorded for the record in media  INDICATIONS:    Tyler Young is a 75 y.o. year old male who presents with massive swelling of the left leg quite painful in association with pleuritic chest pains and hypoxia as well as shortness of breath.  Inferior vena cava filter is indicated for this reason.  Risks and benefits including filter thrombosis, migration, fracture, bleeding, and infection were all discussed.  We discussed that all IVC filters  that we place can be removed if desired from the patient once the need for the filter has passed.    DESCRIPTION: After obtaining full informed written consent, the patient was brought back to the vascular suite. The skin was sterilely prepped and draped in a sterile surgical field was created.  Ultrasound was placed in a sterile sleeve.  The right common femoral vein was image with the ultrasound.  It was echolucent and compressible indicating patency.  Image was recorded for the permanent record. The right common femoral vein was accessed under direct ultrasound guidance without difficulty with a micropuncture needle and a microwire was advanced without difficulty.   A microsheath was then inserted and then a J-wire was then placed. The dilator is passed over the wire and the delivery sheath was placed into the inferior vena cava.  Inferior venacavogram was performed. This demonstrated a patent IVC with the level of the renal veins at L1-L2.  The filter  was then deployed into the inferior vena cava at the level of inferior margin of L2 just below the renal veins. The delivery sheath was then removed. Pressure was held. Sterile dressings were placed. The patient tolerated the procedure well.  The patient was then repositioned to the prone and the popliteal fossa of the left leg was prepped and draped in a sterile fashion. Ultrasound was placed in a sterile sleeve. Ultrasound is utilized to gain access to the popliteal vein. Vein is known to have thrombus within it by previous ultrasound. It is therefore identified by its enlarged size with heterogenous thrombus and non-compressibility. 1% lidocaine is infiltrated in soft tissues and subsequent microneedle is inserted into the popliteal vein without difficulty. Images recorded for the permanent record and the puncture is made under real-time visualization. Microwire is then advanced under fluoroscopic guidance and noted to follow the path of the superficial  femoral vein. Therefore the micro-sheath was placed followed by a Magic torque wire and subsequently an 11 [place] sheath.  KMP catheter together with the Magic torque wire then advanced up to the level of the common iliac and hand injection contrast is utilized to demonstrate that the common iliac vein on the left is patent the inferior vena cava is known to be patent from the above filter placement. Catheter is then repositioned to the common femoral level and hand injection contrast demonstrates that there is a and occlusive thrombus within the common femoral however the external iliac vein on the left appears to be patent. Repositioning of the KMP catheter then demonstrated thrombus within the superficial femoral vein and popliteal vein.  Zelonte catheter is then prepped on the field and 10 mg of TPA is reconstituted 50 cc. This is then laced throughout the common femoral superficial femoral and popliteal veins. The TPA is then allowed to dwell. Subsequently, the penumbra CAT 12 lightening catheter is engaged in the aspiration mode and aspiration of the common femoral as well as the popliteal and superficial femoral veins is performed multiple passes are made with the volumes recorded in the procedure notes.  Follow-up imaging now demonstrates that almost all the thrombus is been eliminated from the proximal popliteal as well as the superficial femoral vein.   Given that the patient now is widely patent with minimal residual thrombus from the distal popliteal to the IVC the procedure is terminated. The wires removed the sheath is removed pressures held and a pressure dressing with Coban and is then applied. The patient is then returned to the supine position  Interpretation: Initial images demonstrated normal vena cava, without any thrombus present.  Denali filter is placed without difficulty. Initial images the left lower extremity then demonstrated extensive thrombus throughout the popliteal and  femoral veins however the iliac veins remained widely patent. Following intervention described above there is near-total resolution of thrombus throughout.  COMPLICATIONS: Small perforation of the proximal popliteal vein noted.  Pressure was held without further issue.  CONDITION: Stable  Hortencia Pilar  04/08/2021,3:24 PM

## 2021-04-08 NOTE — Discharge Instructions (Addendum)
Vascular Surgery Discharge Instructions:  1) You may shower.  Please keep your groins clean and dry.  Gently clean your groins with soap and water.  Gently pat dry. 2) Please do not lift greater than 10 pounds for approximately 2 weeks.

## 2021-04-08 NOTE — Progress Notes (Signed)
Patient ID: Tyler Young, male   DOB: 1945-09-12, 75 y.o.   MRN: [id number] [facility] PROGRESS NOTE  KIRK BASQUEZ WUJ:[id number] DOB: 08-30-1945 DOA: 04/07/2021 PCP: Tracie Harrier, MD  HPI/Subjective: Patient feeling okay.  Feels better than yesterday.  Leg pain is less.  No shortness of breath or chest pain.  Admitted with DVT and PE.  Objective: Vitals:   04/08/21 1304 04/08/21 1315  BP: 131/76   Pulse: 67 72  Resp: (!) 22 19  Temp: 98.1 F (36.7 C)   SpO2: 99% 100%    Intake/Output Summary (Last 24 hours) at 04/08/2021 1401 Last data filed at 04/08/2021 1002 Gross per 24 hour  Intake 703.46 ml  Output 1100 ml  Net -396.54 ml   Filed Weights   04/07/21 1223  Weight: 90.7 kg    ROS: Review of Systems  Respiratory:  Negative for shortness of breath.   Cardiovascular:  Negative for chest pain.  Gastrointestinal:  Negative for abdominal pain, nausea and vomiting.  Exam: Physical Exam HENT:     Head: Normocephalic.     Mouth/Throat:     Pharynx: No oropharyngeal exudate.  Eyes:     General: Lids are normal.     Conjunctiva/sclera: Conjunctivae normal.  Cardiovascular:     Rate and Rhythm: Normal rate and regular rhythm.     Heart sounds: Normal heart sounds, S1 normal and S2 normal.  Pulmonary:     Breath sounds: No decreased breath sounds, wheezing, rhonchi or rales.  Chest:     Comments: Bilateral gynecomastia Abdominal:     Palpations: Abdomen is soft.     Tenderness: There is no abdominal tenderness.  Musculoskeletal:     Right lower leg: No swelling.     Left lower leg: Swelling present.  Skin:    General: Skin is warm.     Findings: No rash.  Neurological:     Mental Status: He is alert and oriented to person, place, and time.      Scheduled Meds:  [MAR Hold] amLODipine  2.5 mg Oral Daily   [MAR Hold] doxazosin  2 mg Oral QPM   fentaNYL       fentaNYL       [MAR Hold] gabapentin  200 mg Oral QHS   [MAR Hold] icosapent Ethyl  1  g Oral Daily   [MAR Hold] insulin aspart  0-5 Units Subcutaneous QHS   [MAR Hold] insulin aspart  0-9 Units Subcutaneous TID WC   [MAR Hold] levothyroxine  125 mcg Oral QAC breakfast   midazolam       [MAR Hold] pantoprazole  40 mg Oral Daily   [MAR Hold] pravastatin  20 mg Oral QPM   [MAR Hold] spironolactone  50 mg Oral BID   Continuous Infusions:  sodium chloride 75 mL/hr at 04/08/21 0006   sodium chloride     clindamycin     [START ON 04/09/2021] clindamycin (CLEOCIN) IV     heparin 1,100 Units/hr (04/08/21 1059)   lactated ringers 30 mL/hr at 04/07/21 1522    Assessment/Plan:  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  Patient currently on heparin drip.  Vascular surgery to do a thrombolysis and thrombectomy of left lower extremity extensive DVT today. Acute kidney injury improved with IV fluid hydrationUp at.  Creatinine improved from 1.41 to 1.07. Essential hypertension.  On Norvasc and doxazosin Hyperlipidemia unspecified on pravastatin Hypothyroidism unspecified on levothyroxine Bilateral gynecomastia.  Will hold spironolactone which is  likely suspect.        Code Status:     Code Status Orders  (From admission, onward)           Start     Ordered   04/07/21 1425  Full code  Continuous        04/07/21 1424           Code Status History     This patient has a current code status but no historical code status.      Family Communication: Declined Disposition Plan: Status is: Inpatient  Dispo:  Patient From: Home  Planned Disposition: Home  Medically stable for discharge: No, having DVT thrombectomy and thrombolysis today   Consultants: Vascular surgery  Time spent: 27 minutes  [place]

## 2021-04-08 NOTE — Consult Note (Signed)
ANTICOAGULATION CONSULT NOTE - Initial Consult  Pharmacy Consult for Heparin infusion Indication: DVT  Allergies  Allergen Reactions   Doxycycline Other (See Comments)    Unsure of exact reaction type   Penicillins Other (See Comments)    Did it involve swelling of the face/tongue/throat, SOB, or low BP? Unknown Did it involve sudden or severe rash/hives, skin peeling, or any reaction on the inside of your mouth or nose? Unknown Did you need to seek medical attention at a hospital or doctor's office? Yes When did it last happen? Childhood reaction at 75 years old       If all above answers are "NO", may proceed with cephalosporin use.     Patient Measurements: Height: 6' (182.9 cm) Weight: 90.7 kg (200 lb) IBW/kg (Calculated) : 77.6 Heparin Dosing Weight: 90.7kg  Vital Signs: BP: 128/74 (09/30 0900) Pulse Rate: 72 (09/30 0900)  Labs: Recent Labs    04/07/21 1230 04/07/21 1429 04/07/21 2119 04/08/21 0627 04/08/21 0849  HGB 15.5  --   --  13.9  --   HCT 43.5  --   --  39.5  --   PLT 179  --   --  158  --   APTT 32  --   --   --   --   LABPROT 13.8  --   --   --   --   INR 1.1  --   --   --   --   HEPARINUNFRC  --   --  0.82* >1.10* >1.10*  CREATININE 1.41*  --   --  1.07  --   TROPONINIHS 3 2  --   --   --      Estimated Creatinine Clearance: 67.5 mL/min (by C-G formula based on SCr of 1.07 mg/dL).   Medical History: Past Medical History:  Diagnosis Date   Anginal pain ([place])    AT REST. STRESS/ECHO 07/25/18   Cancer ([place])    SKIN    Chronic hypokalemia    SEEING [facility]   Dyspnea    DOE   GERD (gastroesophageal reflux disease)    History of kidney stones    Hypertension    Hypothyroidism    Neuropathy    FEET   Pre-diabetes     Assessment: PMH relevant for angina, HTN, pre-diabetes, cancer, hypothyroidism, and neuropathy. Pharmacy consulted to manage heparin infusion for VTE treatment. No history of previous DVE/PE ans no AC prior to  admission.  Date Time HL Rate/Comment  9/29 2119 0.82 Supratherapeutic, decreased to 1400 units/hr   9/30 0627 >1.10 Supra -- recollect 9/30 0849 >1.10 Supra,1400 > 1100 units/hr   Baseline labs: Plt 179 H/H 15.5/43.5 Scr 1.41 INR - 1.1 aPTT - 32  Goal of Therapy:  Heparin level 0.3-0.7 units/ml Monitor platelets by anticoagulation protocol: Yes   Plan:  Heparin remains supratherapeutic despite dose reduction and repeat lab collection. Communicated to nurse, hold infusion x 1 hour.  Resume at lower rate of 1100 units/hr  Recheck HL 8 hrs after rate change.   Dorothe Pea, PharmD, BCPS Clinical Pharmacist

## 2021-04-08 NOTE — Interval H&P Note (Signed)
History and Physical Interval Note:  04/08/2021 1:38 PM  Tyler Young  has presented today for surgery, with the diagnosis of DVT.  The various methods of treatment have been discussed with the patient and family. After consideration of risks, benefits and other options for treatment, the patient has consented to  Procedure(s): PERIPHERAL VASCULAR THROMBECTOMY (Left) as a surgical intervention.  The patient's history has been reviewed, patient examined, no change in status, stable for surgery.  I have reviewed the patient's chart and labs.  Questions were answered to the patient's satisfaction.     Hortencia Pilar

## 2021-04-08 NOTE — ED Notes (Signed)
[place] at bedside performing Echo at this time.

## 2021-04-08 NOTE — Progress Notes (Signed)
*  PRELIMINARY RESULTS* Echocardiogram 2D Echocardiogram has been performed.  Tyler Young 04/08/2021, 8:20 AM

## 2021-04-08 NOTE — Progress Notes (Signed)
Upon bedside report when entering room patient noted to be on [facility] with blood draining from popliteal area of LLE and pooled on floor. Dr. Lorenso Courier contacted and gave order to unwrap leg and apply pressure for 10-15 minutes. Task performed by dayshift nurse. Bleeding stopped and leg re-dressed with gauze, transparent and coban.

## 2021-04-09 DIAGNOSIS — I2609 Other pulmonary embolism with acute cor pulmonale: Secondary | ICD-10-CM

## 2021-04-09 LAB — GLUCOSE, CAPILLARY
Glucose-Capillary: 125 mg/dL — ABNORMAL HIGH (ref 70–99)
Glucose-Capillary: 114 mg/dL — ABNORMAL HIGH (ref 70–99)
Glucose-Capillary: 105 mg/dL — ABNORMAL HIGH (ref 70–99)

## 2021-04-09 LAB — CBC
HCT: 34.4 % — ABNORMAL LOW (ref 39.0–52.0)
Hemoglobin: 12.6 g/dL — ABNORMAL LOW (ref 13.0–17.0)
MCH: 33.2 pg (ref 26.0–34.0)
MCHC: 36.6 g/dL — ABNORMAL HIGH (ref 30.0–36.0)
MCV: 90.5 fL (ref 80.0–100.0)
Platelets: 147 10*3/uL — ABNORMAL LOW (ref 150–400)
RBC: 3.8 MIL/uL — ABNORMAL LOW (ref 4.22–5.81)
RDW: 13 % (ref 11.5–15.5)
WBC: 8.2 10*3/uL (ref 4.0–10.5)
nRBC: 0 % (ref 0.0–0.2)

## 2021-04-09 LAB — BASIC METABOLIC PANEL
Anion gap: 7 (ref 5–15)
BUN: 16 mg/dL (ref 8–23)
CO2: 23 mmol/L (ref 22–32)
Calcium: 8.1 mg/dL — ABNORMAL LOW (ref 8.9–10.3)
Chloride: 105 mmol/L (ref 98–111)
Creatinine, Ser: 0.93 mg/dL (ref 0.61–1.24)
GFR, Estimated: >60 mL/min (ref >60)
Glucose, Bld: 125 mg/dL — ABNORMAL HIGH (ref 70–99)
Potassium: 3.9 mmol/L (ref 3.5–5.1)
Sodium: 135 mmol/L (ref 135–145)

## 2021-04-09 LAB — HEPARIN LEVEL (UNFRACTIONATED): Heparin Unfractionated: 0.28 IU/mL — ABNORMAL LOW (ref 0.30–0.70)

## 2021-04-09 MED ORDER — HEPARIN BOLUS VIA INFUSION
1350.0000 [IU] | Freq: Once | INTRAVENOUS | Status: AC
  Administered 2021-04-09: 1350 [IU] via INTRAVENOUS
  Filled 2021-04-09: qty 1350

## 2021-04-09 MED ORDER — HEPARIN BOLUS VIA INFUSION
2700.0000 [IU] | Freq: Once | INTRAVENOUS | Status: DC
  Filled 2021-04-09: qty 2700

## 2021-04-09 MED ORDER — ACETAMINOPHEN 325 MG PO TABS: 650.0000 mg | ORAL_TABLET | Freq: Four times a day (QID) | ORAL | Status: AC | PRN

## 2021-04-09 MED ORDER — APIXABAN 5 MG PO TABS: 5.0000 mg | ORAL_TABLET | Freq: Two times a day (BID) | ORAL | Status: DC

## 2021-04-09 MED ORDER — APIXABAN 5 MG PO TABS
10.0000 mg | ORAL_TABLET | Freq: Two times a day (BID) | ORAL | Status: DC
  Administered 2021-04-09: 10 mg via ORAL
  Filled 2021-04-09: qty 2

## 2021-04-09 MED ORDER — SPIRONOLACTONE 25 MG PO TABS
50.0000 mg | ORAL_TABLET | Freq: Two times a day (BID) | ORAL | Status: DC
  Administered 2021-04-09: 50 mg via ORAL
  Filled 2021-04-09: qty 2

## 2021-04-09 MED ORDER — APIXABAN 5 MG PO TABS: ORAL_TABLET | ORAL | 0 refills | Status: DC

## 2021-04-09 NOTE — Progress Notes (Signed)
Patient ID: Tyler Young, male   DOB: [birth year], 75 y.o.   MRN: [id number]   04/09/21 1200  Mobility  Activity Ambulated in hall  Level of Assistance Modified independent, requires aide device or extra time  Assistive Device Front wheel walker  Minutes Ambulated 5 minutes  Distance Ambulated (ft) 250 ft  Mobility Response Tolerated fair  Mobility performed by Nurse  Progressive Mobility Assessment: RN to perform for Med/Surg and Progressive Care at admission/transfer/prn  What is the highest level of mobility based on the progressive mobility assessment? Level 5 (Walks with assist in room/hall) - Balance while stepping forward/back and can walk in room with assist - Complete  Is the above level different from baseline mobility prior to current illness? Yes - Recommend PT order    Haydee Salter, RN

## 2021-04-09 NOTE — Discharge Summary (Signed)
[facility] [place] NAME: Tyler Young    MR#:  [id number]  DATE OF BIRTH:  1945/08/21  DATE OF ADMISSION:  04/07/2021 ADMITTING PHYSICIAN: Loletha Grayer, MD  DATE OF DISCHARGE: 04/09/2021  PRIMARY CARE PHYSICIAN: Tracie Harrier, MD    ADMISSION DIAGNOSIS:  DVT (deep venous thrombosis) (HCC) [I82.409] Pulmonary emboli (HCC) [I26.99] Acute deep vein thrombosis (DVT) of proximal vein of left lower extremity ([place]) [I82.4Y2]  DISCHARGE DIAGNOSIS:  Active Problems:   DVT (deep venous thrombosis) ([place])   Pulmonary infarction ([place])   AKI (acute kidney injury) ([place])   Essential hypertension   Hypothyroidism   Hyperlipidemia   Gynecomastia, male   SECONDARY DIAGNOSIS:   Past Medical History:  Diagnosis Date  . AKI (acute kidney injury) ([place])   . Anginal pain ([place])    AT REST. STRESS/ECHO 07/25/18  . Cancer ([place])    SKIN   . Chronic hypokalemia    SEEING DR [facility]  . Dyspnea    DOE  . GERD (gastroesophageal reflux disease)   . History of kidney stones   . Hypertension   . Hypothyroidism   . Neuropathy    FEET  . Pre-diabetes     HOSPITAL COURSE:   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  Patient was on heparin drip yesterday and converted over to Eliquis today.  Vascular surgery did a thrombolysis and thrombectomy of left lower extremity extensive DVT.  This morning patient was in occlusive pressure wrap.  Case discussed with vascular surgery and they were okay switching to Eliquis on discharge. Acute kidney injury.  Improved with IV fluid hydration.  Creatinine 1.41 on presentation and down to 0.93. Essential hypertension on Norvasc, doxazosin and spironolactone Hyperlipidemia unspecified on pravastatin Hypothyroidism unspecified on levothyroxine Bilateral gynecomastia.  I mention to the patient that its likely because of the spironolactone.  Can consider mammogram as  outpatient.  DISCHARGE CONDITIONS:   Satisfactory  CONSULTS OBTAINED:  Treatment Team:  Katha Cabal, MD  DRUG ALLERGIES:   Allergies  Allergen Reactions  . Doxycycline Other (See Comments)    Unsure of exact reaction type  . Penicillins Other (See Comments)    Did it involve swelling of the face/tongue/throat, SOB, or low BP? Unknown Did it involve sudden or severe rash/hives, skin peeling, or any reaction on the inside of your mouth or nose? Unknown Did you need to seek medical attention at a hospital or doctor's office? Yes When did it last happen? Childhood reaction at 75 years old       If all above answers are "NO", may proceed with cephalosporin use.     DISCHARGE MEDICATIONS:   Allergies as of 04/09/2021       Reactions   Doxycycline Other (See Comments)   Unsure of exact reaction type   Penicillins Other (See Comments)   Did it involve swelling of the face/tongue/throat, SOB, or low BP? Unknown Did it involve sudden or severe rash/hives, skin peeling, or any reaction on the inside of your mouth or nose? Unknown Did you need to seek medical attention at a hospital or doctor's office? Yes When did it last happen? Childhood reaction at 75 years old       If all above answers are "NO", may proceed with cephalosporin use.        Medication List     STOP taking these medications    naproxen sodium 220 MG tablet Commonly known  as: ALEVE       TAKE these medications    acetaminophen 325 MG tablet Commonly known as: TYLENOL Take 2 tablets (650 mg total) by mouth every 6 (six) hours as needed for mild pain, fever or headache.   amLODipine 5 MG tablet Commonly known as: NORVASC Take 2.5 mg by mouth daily.   apixaban 5 MG Tabs tablet Commonly known as: ELIQUIS Two tablets (10mg ) twice a day for 7 days then one tablet (5mg ) twice a day afterwards   diphenhydrAMINE 25 MG tablet Commonly known as: SOMINEX Take 25 mg by mouth at bedtime.   doxazosin  2 MG tablet Commonly known as: CARDURA Take 2 mg by mouth every evening.   gabapentin 100 MG capsule Commonly known as: NEURONTIN Take 200 mg by mouth at bedtime.   levothyroxine 125 MCG tablet Commonly known as: SYNTHROID Take 125 mcg by mouth daily before breakfast.   omeprazole 20 MG capsule Commonly known as: PRILOSEC Take 20 mg by mouth every morning.   pravastatin 20 MG tablet Commonly known as: PRAVACHOL Take 20 mg by mouth every evening.   PRESCRIPTION MEDICATION Apply 1 application topically 2 (two) times daily. Nifedipine 0.3% and lidocaine 2%   spironolactone 50 MG tablet Commonly known as: ALDACTONE Take 50 mg by mouth 2 (two) times daily.   triamcinolone 55 MCG/ACT Aero nasal inhaler Commonly known as: NASACORT Place 1-2 sprays into the nose daily as needed (allergies).   Vascepa 0.5 g Caps Generic drug: Icosapent Ethyl Take 0.5 g by mouth daily.         DISCHARGE INSTRUCTIONS:   Follow-up with PMD 5 days Follow-up vascular surgery  If you experience worsening of your admission symptoms, develop shortness of breath, life threatening emergency, suicidal or homicidal thoughts you must seek medical attention immediately by calling 911 or calling your MD immediately  if symptoms less severe.  You Must read complete instructions/literature along with all the possible adverse reactions/side effects for all the Medicines you take and that have been prescribed to you. Take any new Medicines after you have completely understood and accept all the possible adverse reactions/side effects.   Please note  You were cared for by a hospitalist during your hospital stay. If you have any questions about your discharge medications or the care you received while you were in the hospital after you are discharged, you can call the unit and asked to speak with the hospitalist on call if the hospitalist that took care of you is not available. Once you are discharged, your  primary care physician will handle any further medical issues. Please note that NO REFILLS for any discharge medications will be authorized once you are discharged, as it is imperative that you return to your primary care physician (or establish a relationship with a primary care physician if you do not have one) for your aftercare needs so that they can reassess your need for medications and monitor your lab values.    Today   CHIEF COMPLAINT:   Chief Complaint  Patient presents with  . Leg Swelling    HISTORY OF PRESENT ILLNESS:  Tyler Young  is a 75 y.o. male came in with leg pain and swelling   VITAL SIGNS:  Blood pressure 136/80, pulse 76, temperature 97.7 F (36.5 C), temperature source Oral, resp. rate 18, height 6' (1.829 m), weight 90.7 kg, SpO2 98 %.  I/O:   Intake/Output Summary (Last 24 hours) at 04/09/2021 1648 Last data filed at 04/09/2021 1034  Gross per 24 hour  Intake 719.74 ml  Output 600 ml  Net 119.74 ml    PHYSICAL EXAMINATION:  GENERAL:  75 y.o.-year-old patient lying in the bed with no acute distress.  EYES: Pupils equal, round, reactive to light and accommodation. No scleral icterus.  HEENT: Head atraumatic, normocephalic. Oropharynx and nasopharynx clear.  LUNGS: Normal breath sounds bilaterally, no wheezing, rales,rhonchi or crepitation. No use of accessory muscles of respiration.  CARDIOVASCULAR: S1, S2 normal. No murmurs, rubs, or gallops.  ABDOMEN: Soft, non-tender, non-distended.  EXTREMITIES: Left leg in pressure wrap.  NEUROLOGIC: Cranial nerves II through XII are intact. Muscle strength 5/5 in all extremities. Sensation intact. Gait not checked.  PSYCHIATRIC: The patient is alert and oriented x 3.  SKIN: No obvious rash, lesion, or ulcer.   DATA REVIEW:   CBC Recent Labs  Lab 04/09/21 0516  WBC 8.2  HGB 12.6*  HCT 34.4*  PLT 147*    Chemistries  Recent Labs  Lab 04/08/21 0627 04/09/21 0516  NA 136 135  K 4.0 3.9  CL 103 105   CO2 25 23  GLUCOSE 135* 125*  BUN 17 16  CREATININE 1.07 0.93  CALCIUM 8.3* 8.1*  AST 17  --   ALT 18  --   ALKPHOS 51  --   BILITOT 1.0  --     Microbiology Results  Results for orders placed or performed during the hospital encounter of 04/07/21  Resp Panel by RT-PCR (Flu A&B, Covid) Nasopharyngeal Swab     Status: None   Collection Time: 04/07/21  2:29 PM   Specimen: Nasopharyngeal Swab; Nasopharyngeal(NP) swabs in vial transport medium  Result Value Ref Range Status   SARS Coronavirus 2 by RT PCR NEGATIVE NEGATIVE Final    Comment: (NOTE) SARS-CoV-2 target nucleic acids are NOT DETECTED.  The SARS-CoV-2 RNA is generally detectable in upper respiratory specimens during the acute phase of infection. The lowest concentration of SARS-CoV-2 viral copies this assay can detect is 138 copies/mL. A negative result does not preclude SARS-Cov-2 infection and should not be used as the sole basis for treatment or other patient management decisions. A negative result may occur with  improper specimen collection/handling, submission of specimen other than nasopharyngeal swab, presence of viral mutation(s) within the areas targeted by this assay, and inadequate number of viral copies(<138 copies/mL). A negative result must be combined with clinical observations, patient history, and epidemiological information. The expected result is Negative.  Fact Sheet for Patients:  EntrepreneurPulse.com.au  Fact Sheet for Healthcare Providers:  IncredibleEmployment.be  This test is no t yet approved or cleared by the [place] FDA and  has been authorized for detection and/or diagnosis of SARS-CoV-2 by FDA under an Emergency Use Authorization (EUA). This EUA will remain  in effect (meaning this test can be used) for the duration of the COVID-19 declaration under Section 564(b)(1) of the Act, 21 U.S.C.section 360bbb-3(b)(1), unless the authorization is  terminated  or revoked sooner.       Influenza A by PCR NEGATIVE NEGATIVE Final   Influenza B by PCR NEGATIVE NEGATIVE Final    Comment: (NOTE) The Xpert Xpress SARS-CoV-2/FLU/RSV plus assay is intended as an aid in the diagnosis of influenza from Nasopharyngeal swab specimens and should not be used as a sole basis for treatment. Nasal washings and aspirates are unacceptable for Xpert Xpress SARS-CoV-2/FLU/RSV testing.  Fact Sheet for Patients: EntrepreneurPulse.com.au  Fact Sheet for Healthcare Providers: IncredibleEmployment.be  This test is not yet approved or cleared  by the [place] and has been authorized for detection and/or diagnosis of SARS-CoV-2 by FDA under an Emergency Use Authorization (EUA). This EUA will remain in effect (meaning this test can be used) for the duration of the COVID-19 declaration under Section 564(b)(1) of the Act, 21 U.S.C. section 360bbb-3(b)(1), unless the authorization is terminated or revoked.  Performed at [facility], [address] [place], [place] [address]     RADIOLOGY:  PERIPHERAL VASCULAR CATHETERIZATION  Result Date: 04/08/2021 See surgical note for result.  ECHOCARDIOGRAM COMPLETE  Result Date: 04/08/2021    ECHOCARDIOGRAM REPORT   Patient Name:   Tyler Young [place] Date of Exam: 04/08/2021 Medical Rec #:  [id number]       Height:       72.0 in Accession #:    [id number]      Weight:       200.0 lb Date of Birth:  21-May-1946      BSA:          2.131 m Patient Age:    60 years        BP:           120/65 mmHg Patient Gender: M               HR:           50 bpm. Exam Location:  [facility] Procedure: 2D Echo, Cardiac Doppler and Color Doppler Indications:     Pulmonary embolus I26.09  History:         Patient has no prior history of Echocardiogram examinations.                  Angina; Risk Factors:Hypertension.  Sonographer:     Sherrie Sport Referring Phys:  [id number] [place]  Diagnosing Phys: Nelva Bush MD IMPRESSIONS  1. Left ventricular ejection fraction, by estimation, is 60 to 65%. The left ventricle has normal function. Left ventricular endocardial border not optimally defined to evaluate regional wall motion. There is mild left ventricular hypertrophy. Left ventricular diastolic parameters are consistent with Grade I diastolic dysfunction (impaired relaxation).  2. Right ventricular systolic function is normal. The right ventricular size is normal. Tricuspid regurgitation signal is inadequate for assessing PA pressure.  3. Left atrial size was mildly dilated.  4. Right atrial size was mildly dilated.  5. The mitral valve was not well visualized. No evidence of mitral valve regurgitation. No evidence of mitral stenosis.  6. The aortic valve is tricuspid. There is mild calcification of the aortic valve. There is mild thickening of the aortic valve. Aortic valve regurgitation is not visualized. No aortic stenosis is present. FINDINGS  Left Ventricle: Left ventricular ejection fraction, by estimation, is 60 to 65%. The left ventricle has normal function. Left ventricular endocardial border not optimally defined to evaluate regional wall motion. The left ventricular internal cavity size was normal in size. There is mild left ventricular hypertrophy. Left ventricular diastolic parameters are consistent with Grade I diastolic dysfunction (impaired relaxation). Right Ventricle: The right ventricular size is normal. No increase in right ventricular wall thickness. Right ventricular systolic function is normal. Tricuspid regurgitation signal is inadequate for assessing PA pressure. Left Atrium: Left atrial size was mildly dilated. Right Atrium: Right atrial size was mildly dilated. Pericardium: Trivial pericardial effusion is present. Mitral Valve: The mitral valve was not well visualized. Mild mitral annular calcification. No evidence of mitral valve regurgitation. No evidence of  mitral valve stenosis. Tricuspid Valve: The tricuspid valve  is not well visualized. Tricuspid valve regurgitation is not demonstrated. Aortic Valve: The aortic valve is tricuspid. There is mild calcification of the aortic valve. There is mild thickening of the aortic valve. Aortic valve regurgitation is not visualized. No aortic stenosis is present. Aortic valve mean gradient measures 4.0 mmHg. Aortic valve peak gradient measures 7.0 mmHg. Aortic valve area, by VTI measures 2.58 cm. Pulmonic Valve: The pulmonic valve was not well visualized. Pulmonic valve regurgitation is not visualized. No evidence of pulmonic stenosis. Aorta: The aortic root is normal in size and structure. Pulmonary Artery: The pulmonary artery is not well seen. Venous: The inferior vena cava was not well visualized. IAS/Shunts: The interatrial septum was not well visualized.  LEFT VENTRICLE PLAX 2D LVIDd:         4.50 cm  Diastology LVIDs:         2.90 cm  LV e' medial:    7.51 cm/s LV PW:         1.20 cm  LV E/e' medial:  11.8 LV IVS:        0.95 cm  LV e' lateral:   8.05 cm/s LVOT diam:     2.10 cm  LV E/e' lateral: 11.0 LV SV:         70 LV SV Index:   33 LVOT Area:     3.46 cm  RIGHT VENTRICLE RV Basal diam:  2.90 cm RV S prime:     13.80 cm/s TAPSE (M-mode): 2.1 cm LEFT ATRIUM             Index       RIGHT ATRIUM           Index LA diam:        3.70 cm 1.74 cm/m  RA Area:     22.70 cm LA Vol (A2C):   84.0 ml 39.43 ml/m RA Volume:   65.70 ml  30.84 ml/m LA Vol (A4C):   84.7 ml 39.75 ml/m LA Biplane Vol: 88.3 ml 41.44 ml/m  AORTIC VALVE                   PULMONIC VALVE AV Area (Vmax):    2.17 cm    PV Vmax:        0.67 m/s AV Area (Vmean):   2.32 cm    PV Peak grad:   1.8 mmHg AV Area (VTI):     2.58 cm    RVOT Peak grad: 2 mmHg AV Vmax:           132.50 cm/s AV Vmean:          88.100 cm/s AV VTI:            0.270 m AV Peak Grad:      7.0 mmHg AV Mean Grad:      4.0 mmHg LVOT Vmax:         82.90 cm/s LVOT Vmean:        58.900 cm/s  LVOT VTI:          0.201 m LVOT/AV VTI ratio: 0.75  AORTA Ao Root diam: 3.50 cm MITRAL VALVE MV Area (PHT): 2.47 cm     SHUNTS MV Decel Time: 307 msec     Systemic VTI:  0.20 m MV E velocity: 88.70 cm/s   Systemic Diam: 2.10 cm MV A velocity: 108.00 cm/s MV E/A ratio:  0.82 Harrell Gave End MD Electronically signed by Nelva Bush MD Signature Date/Time: 04/08/2021/12:31:45 PM    Final  Management plans discussed with the patient, and he is in agreement.  CODE STATUS:     Code Status Orders  (From admission, onward)           Start     Ordered   04/07/21 1425  Full code  Continuous        04/07/21 1424           Code Status History     This patient has a current code status but no historical code status.       TOTAL TIME TAKING CARE OF THIS PATIENT: 33 minutes.    Loletha Grayer M.D on 04/09/2021 at 4:48 PM  [facility]  CC: Primary care physician; Tracie Harrier, MD

## 2021-04-09 NOTE — Progress Notes (Signed)
Patient ID: Tyler Young, male   DOB: Jul 18, 1945, 75 y.o.   MRN: [id number]   Eliquis 30 day free card given to patient.  Haydee Salter, RN

## 2021-04-09 NOTE — Consult Note (Signed)
ANTICOAGULATION CONSULT NOTE - Initial Consult  Pharmacy Consult for Heparin infusion Indication: DVT  Allergies  Allergen Reactions   Doxycycline Other (See Comments)    Unsure of exact reaction type   Penicillins Other (See Comments)    Did it involve swelling of the face/tongue/throat, SOB, or low BP? Unknown Did it involve sudden or severe rash/hives, skin peeling, or any reaction on the inside of your mouth or nose? Unknown Did you need to seek medical attention at a hospital or doctor's office? Yes When did it last happen? Childhood reaction at 75 years old       If all above answers are "NO", may proceed with cephalosporin use.     Patient Measurements: Height: 6' (182.9 cm) Weight: 90.7 kg (200 lb) IBW/kg (Calculated) : 77.6 Heparin Dosing Weight: 90.7kg  Vital Signs: Temp: 98 F (36.7 C) (10/01 0517) Temp Source: Oral (10/01 0517) BP: 123/70 (10/01 0517) Pulse Rate: 62 (10/01 0517)  Labs: Recent Labs    04/07/21 1230 04/07/21 1429 04/07/21 2119 04/08/21 0627 04/08/21 0849 04/09/21 0516  HGB 15.5  --   --  13.9  --  12.6*  HCT 43.5  --   --  39.5  --  34.4*  PLT 179  --   --  158  --  147*  APTT 32  --   --   --   --   --   LABPROT 13.8  --   --   --   --   --   INR 1.1  --   --   --   --   --   HEPARINUNFRC  --   --    < > >1.10* >1.10* 0.28*  CREATININE 1.41*  --   --  1.07  --   --   TROPONINIHS 3 2  --   --   --   --    < > = values in this interval not displayed.     Estimated Creatinine Clearance: 67.5 mL/min (by C-G formula based on SCr of 1.07 mg/dL).   Medical History: Past Medical History:  Diagnosis Date   AKI (acute kidney injury) ([place])    Anginal pain ([place])    AT REST. STRESS/ECHO 07/25/18   Cancer ([place])    SKIN    Chronic hypokalemia    SEEING DR [facility]   Dyspnea    DOE   GERD (gastroesophageal reflux disease)    History of kidney stones    Hypertension    Hypothyroidism    Neuropathy    FEET   Pre-diabetes      Assessment: PMH relevant for angina, HTN, pre-diabetes, cancer, hypothyroidism, and neuropathy. Pharmacy consulted to manage heparin infusion for VTE treatment. No history of previous DVE/PE ans no AC prior to admission.  Date Time HL Rate/Comment  9/29 2119 0.82 Supratherapeutic, decreased to 1400 units/hr   9/30 0627 >1.10 Supra -- recollect 9/30 0849 >1.10 Supra,1400 > 1100 units/hr  10/01   0516    0.28    Subtherapeutic   Baseline labs: Plt 179 H/H 15.5/43.5 Scr 1.41 INR - 1.1 aPTT - 32  Goal of Therapy:  Heparin level 0.3-0.7 units/ml Monitor platelets by anticoagulation protocol: Yes   Plan:  10/1:  HL @ 0516 = 0.28, subtherapeutic  Will order heparin 1350 units IV X 1 and increase drip rate to 1450 units/hr.  Will recheck HL 8 hrs after rate change.   Orene Desanctis, PharmD Clinical Pharmacist

## 2021-04-09 NOTE — Plan of Care (Signed)
Patient ID: Tyler Young, male   DOB: [birth year], 75 y.o.   MRN: [id number]   Problem: Education: Goal: Knowledge of General Education information will improve Description: Including pain rating scale, medication(s)/side effects and non-pharmacologic comfort measures 04/09/2021 1335 by Haydee Salter, RN Outcome: Progressing 04/09/2021 1312 by Haydee Salter, RN Outcome: Adequate for Discharge   Problem: Health Behavior/Discharge Planning: Goal: Ability to manage health-related needs will improve 04/09/2021 1335 by Haydee Salter, RN Outcome: Progressing 04/09/2021 1312 by Haydee Salter, RN Outcome: Adequate for Discharge   Problem: Clinical Measurements: Goal: Ability to maintain clinical measurements within normal limits will improve 04/09/2021 1335 by Haydee Salter, RN Outcome: Progressing 04/09/2021 1312 by Haydee Salter, RN Outcome: Adequate for Discharge Goal: Will remain free from infection 04/09/2021 1335 by Haydee Salter, RN Outcome: Progressing 04/09/2021 1312 by Haydee Salter, RN Outcome: Adequate for Discharge Goal: Diagnostic test results will improve 04/09/2021 1335 by Haydee Salter, RN Outcome: Progressing 04/09/2021 1312 by Patterson Hammersmith D, RN Outcome: Adequate for Discharge Goal: Respiratory complications will improve 04/09/2021 1335 by Haydee Salter, RN Outcome: Progressing 04/09/2021 1312 by Haydee Salter, RN Outcome: Adequate for Discharge Goal: Cardiovascular complication will be avoided 04/09/2021 1335 by Haydee Salter, RN Outcome: Progressing 04/09/2021 1312 by Haydee Salter, RN Outcome: Adequate for Discharge   Problem: Activity: Goal: Risk for activity intolerance will decrease 04/09/2021 1335 by Haydee Salter, RN Outcome: Progressing 04/09/2021 1312 by Haydee Salter, RN Outcome: Adequate for Discharge   Problem: Nutrition: Goal: Adequate nutrition will be maintained 04/09/2021 1335 by Haydee Salter, RN Outcome: Progressing 04/09/2021 1312 by Haydee Salter, RN Outcome: Adequate for Discharge   Problem: Coping: Goal: Level of anxiety will decrease 04/09/2021 1335 by Haydee Salter, RN Outcome: Progressing 04/09/2021 1312 by Haydee Salter, RN Outcome: Adequate for Discharge   Problem: Elimination: Goal: Will not experience complications related to bowel motility 04/09/2021 1335 by Haydee Salter, RN Outcome: Progressing 04/09/2021 1312 by Haydee Salter, RN Outcome: Adequate for Discharge Goal: Will not experience complications related to urinary retention 04/09/2021 1335 by Haydee Salter, RN Outcome: Progressing 04/09/2021 1312 by Haydee Salter, RN Outcome: Adequate for Discharge   Problem: Pain Managment: Goal: General experience of comfort will improve 04/09/2021 1335 by Haydee Salter, RN Outcome: Progressing 04/09/2021 1312 by Haydee Salter, RN Outcome: Adequate for Discharge   Problem: Safety: Goal: Ability to remain free from injury will improve 04/09/2021 1335 by Haydee Salter, RN Outcome: Progressing 04/09/2021 1312 by Haydee Salter, RN Outcome: Adequate for Discharge   Problem: Skin Integrity: Goal: Risk for impaired skin integrity will decrease 04/09/2021 1335 by Haydee Salter, RN Outcome: Progressing 04/09/2021 1312 by Haydee Salter, RN Outcome: Adequate for Discharge    Haydee Salter, RN

## 2021-04-09 NOTE — TOC CM/SW Note (Addendum)
Provided pt with 30 day Eliquis coupon.  Letta Kocher, LCSW

## 2021-04-09 NOTE — Progress Notes (Signed)
1 Day Post-Op   Subjective/Chief Complaint: Doing OK. Denies left leg pain.  No further bleeding from site overnight.    Objective: Vital signs in last 24 hours: Temp:  [97.5 F (36.4 C)-99 F (37.2 C)] 98.9 F (37.2 C) (10/01 0747) Pulse Rate:  [54-72] 63 (10/01 0747) Resp:  [15-22] 18 (10/01 0747) BP: (108-131)/(50-78) 110/50 (10/01 0747) SpO2:  [97 %-100 %] 98 % (10/01 0747) Last BM Date: 04/08/21  Intake/Output from previous day: 09/30 0701 - 10/01 0700 In: 1222.3 [I.V.:1172.3; IV Piggyback:50] Out: 400 [Urine:400] Intake/Output this shift: Total I/O In: 240 [P.O.:240] Out: 200 [Urine:200]  General appearance: alert and no distress LLE- warm, calf/thigh soft, access site- dry, foot warm  Lab Results:  Recent Labs    04/08/21 0627 04/09/21 0516  WBC 8.6 8.2  HGB 13.9 12.6*  HCT 39.5 34.4*  PLT 158 147*   BMET Recent Labs    04/08/21 0627 04/09/21 0516  NA 136 135  K 4.0 3.9  CL 103 105  CO2 25 23  GLUCOSE 135* 125*  BUN 17 16  CREATININE 1.07 0.93  CALCIUM 8.3* 8.1*   PT/INR Recent Labs    04/07/21 1230  LABPROT 13.8  INR 1.1   ABG No results for input(s): PHART, HCO3 in the last 72 hours.  Invalid input(s): PCO2, PO2  Studies/Results: CT Angio Chest PE W and/or Wo Contrast  Result Date: 04/07/2021 CLINICAL DATA:  Short of breath with exertion.  Left leg swelling. EXAM: CT ANGIOGRAPHY CHEST WITH CONTRAST TECHNIQUE: Multidetector CT imaging of the chest was performed using the standard protocol during bolus administration of intravenous contrast. Multiplanar CT image reconstructions and MIPs were obtained to evaluate the vascular anatomy. CONTRAST:  4mL OMNIPAQUE IOHEXOL 350 MG/ML SOLN COMPARISON:  01/13/2002 plain film FINDINGS: Cardiovascular: The quality of this exam for evaluation of pulmonary embolism is sufficient. Acute pulmonary artery filling defects including within the distal right pulmonary artery on 41/4 and within lobar to  segmental branches of the right lower lobe including on 51 through 55 of series 4. Smaller volume left-sided emboli including to the left lower lobe on 46/4. No evidence of right heart strain. Aortic atherosclerosis. Tortuous thoracic aorta. Mild cardiomegaly, without pericardial effusion. Lad coronary artery calcification. Mediastinum/Nodes: Partial thyroidectomy. No mediastinal or hilar adenopathy. Tiny hiatal hernia. Mild distal esophageal wall thickening. Lungs/Pleura: No pleural fluid. Left lower lobe calcified granuloma laterally. Areas of mild peripheral predominant airspace and ground-glass opacity involving the right upper lobe on 43/6, right middle lobe on 51/6, and right lower lobe on 73/6. Upper Abdomen: Nonspecific caudate lobe enlargement. Normal imaged portions of the spleen, pancreas, gallbladder, adrenal glands, left kidney Musculoskeletal: Marked left and moderate right sided gynecomastia. 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 Review of the MIP images confirms the above findings. IMPRESSION: 1. Bilateral pulmonary emboli, without evidence of right heart strain. 2. Areas of peripheral predominant airspace and ground-glass opacity, favored to represent infarcts. 3. Tiny hiatal hernia. Esophageal wall thickening suggests esophagitis. 4. Coronary artery atherosclerosis. Aortic Atherosclerosis (ICD10-I70.0). 5. Bilateral gynecomastia. These results were called by telephone at the time of interpretation on 04/07/2021 at 3:05 pm to provider dr. Kerman Passey, who verbally acknowledged these results. Electronically Signed   By: Abigail Miyamoto M.D.   On: 04/07/2021 15:07   PERIPHERAL VASCULAR CATHETERIZATION  Result Date: 04/08/2021 See surgical note for result.  US Venous Img Lower Unilateral Left  Result Date: 04/07/2021 CLINICAL DATA:  Leg swelling EXAM: Left LOWER EXTREMITY VENOUS DOPPLER ULTRASOUND TECHNIQUE: Gray-scale sonography  with graded compression, as well as color Doppler and duplex ultrasound were  performed to evaluate the lower extremity deep venous systems from the level of the common femoral vein and including the common femoral, femoral, profunda femoral, popliteal and calf veins including the posterior tibial, peroneal and gastrocnemius veins when visible. The superficial great saphenous vein was also interrogated. Spectral Doppler was utilized to evaluate flow at rest and with distal augmentation maneuvers in the common femoral, femoral and popliteal veins. COMPARISON:  None. FINDINGS: Contralateral Common Femoral Vein: Respiratory phasicity is normal and symmetric with the symptomatic side. No evidence of thrombus. Normal compressibility. Common Femoral Vein: There is occlusive thrombus in the left common femoral vein. Saphenofemoral Junction: There is occlusive thrombus at the saphenofemoral junction. Profunda Femoral Vein: There is occlusive thrombus in the profunda femoral vein. Femoral Vein: There is occlusive thrombus in the femoral vein. Popliteal Vein: There is occlusive thrombus in the popliteal vein. Calf Veins: There is occlusive thrombus in the imaged calf veins. Superficial Great Saphenous Vein: There is apparent Doppler flow within the imaged portions of the great saphenous vein. Venous Reflux:  None. Other Findings:  None. IMPRESSION: Extensive occlusive thrombus throughout the left lower extremity from the calf to the groin. These results were called by telephone at the time of interpretation on 04/07/2021 at 1:56 Pm to provider Harvest Dark, who verbally acknowledged these results. Electronically Signed   By: Valetta Mole M.D.   On: 04/07/2021 14:11   ECHOCARDIOGRAM COMPLETE  Result Date: 04/08/2021    ECHOCARDIOGRAM REPORT   Patient Name:   Tyler Young Date of Exam: 04/08/2021 Medical Rec #:  [id number]       Height:       72.0 in Accession #:    [id number]      Weight:       200.0 lb Date of Birth:  05-28-1946      BSA:          2.131 m Patient Age:    75 years        BP:            120/65 mmHg Patient Gender: M               HR:           50 bpm. Exam Location:  [facility] Procedure: 2D Echo, Cardiac Doppler and Color Doppler Indications:     Pulmonary embolus I26.09  History:         Patient has no prior history of Echocardiogram examinations.                  Angina; Risk Factors:Hypertension.  Sonographer:     Sherrie Sport Referring Phys:  [id number] [place] Diagnosing Phys: Nelva Bush MD IMPRESSIONS  1. Left ventricular ejection fraction, by estimation, is 60 to 65%. The left ventricle has normal function. Left ventricular endocardial border not optimally defined to evaluate regional wall motion. There is mild left ventricular hypertrophy. Left ventricular diastolic parameters are consistent with Grade I diastolic dysfunction (impaired relaxation).  2. Right ventricular systolic function is normal. The right ventricular size is normal. Tricuspid regurgitation signal is inadequate for assessing PA pressure.  3. Left atrial size was mildly dilated.  4. Right atrial size was mildly dilated.  5. The mitral valve was not well visualized. No evidence of mitral valve regurgitation. No evidence of mitral stenosis.  6. The aortic valve is tricuspid. There is mild calcification of the aortic valve.  There is mild thickening of the aortic valve. Aortic valve regurgitation is not visualized. No aortic stenosis is present. FINDINGS  Left Ventricle: Left ventricular ejection fraction, by estimation, is 60 to 65%. The left ventricle has normal function. Left ventricular endocardial border not optimally defined to evaluate regional wall motion. The left ventricular internal cavity size was normal in size. There is mild left ventricular hypertrophy. Left ventricular diastolic parameters are consistent with Grade I diastolic dysfunction (impaired relaxation). Right Ventricle: The right ventricular size is normal. No increase in right ventricular wall thickness. Right ventricular systolic function is  normal. Tricuspid regurgitation signal is inadequate for assessing PA pressure. Left Atrium: Left atrial size was mildly dilated. Right Atrium: Right atrial size was mildly dilated. Pericardium: Trivial pericardial effusion is present. Mitral Valve: The mitral valve was not well visualized. Mild mitral annular calcification. No evidence of mitral valve regurgitation. No evidence of mitral valve stenosis. Tricuspid Valve: The tricuspid valve is not well visualized. Tricuspid valve regurgitation is not demonstrated. Aortic Valve: The aortic valve is tricuspid. There is mild calcification of the aortic valve. There is mild thickening of the aortic valve. Aortic valve regurgitation is not visualized. No aortic stenosis is present. Aortic valve mean gradient measures 4.0 mmHg. Aortic valve peak gradient measures 7.0 mmHg. Aortic valve area, by VTI measures 2.58 cm. Pulmonic Valve: The pulmonic valve was not well visualized. Pulmonic valve regurgitation is not visualized. No evidence of pulmonic stenosis. Aorta: The aortic root is normal in size and structure. Pulmonary Artery: The pulmonary artery is not well seen. Venous: The inferior vena cava was not well visualized. IAS/Shunts: The interatrial septum was not well visualized.  LEFT VENTRICLE PLAX 2D LVIDd:         4.50 cm  Diastology LVIDs:         2.90 cm  LV e' medial:    7.51 cm/s LV PW:         1.20 cm  LV E/e' medial:  11.8 LV IVS:        0.95 cm  LV e' lateral:   8.05 cm/s LVOT diam:     2.10 cm  LV E/e' lateral: 11.0 LV SV:         70 LV SV Index:   33 LVOT Area:     3.46 cm  RIGHT VENTRICLE RV Basal diam:  2.90 cm RV S prime:     13.80 cm/s TAPSE (M-mode): 2.1 cm LEFT ATRIUM             Index       RIGHT ATRIUM           Index LA diam:        3.70 cm 1.74 cm/m  RA Area:     22.70 cm LA Vol (A2C):   84.0 ml 39.43 ml/m RA Volume:   65.70 ml  30.84 ml/m LA Vol (A4C):   84.7 ml 39.75 ml/m LA Biplane Vol: 88.3 ml 41.44 ml/m  AORTIC VALVE                    PULMONIC VALVE AV Area (Vmax):    2.17 cm    PV Vmax:        0.67 m/s AV Area (Vmean):   2.32 cm    PV Peak grad:   1.8 mmHg AV Area (VTI):     2.58 cm    RVOT Peak grad: 2 mmHg AV Vmax:  132.50 cm/s AV Vmean:          88.100 cm/s AV VTI:            0.270 m AV Peak Grad:      7.0 mmHg AV Mean Grad:      4.0 mmHg LVOT Vmax:         82.90 cm/s LVOT Vmean:        58.900 cm/s LVOT VTI:          0.201 m LVOT/AV VTI ratio: 0.75  AORTA Ao Root diam: 3.50 cm MITRAL VALVE MV Area (PHT): 2.47 cm     SHUNTS MV Decel Time: 307 msec     Systemic VTI:  0.20 m MV E velocity: 88.70 cm/s   Systemic Diam: 2.10 cm MV A velocity: 108.00 cm/s MV E/A ratio:  0.82 Harrell Gave End MD Electronically signed by Nelva Bush MD Signature Date/Time: 04/08/2021/12:31:45 PM    Final     Anti-infectives: Anti-infectives (From admission, onward)    Start     Dose/Rate Route Frequency Ordered Stop   04/09/21 0600  clindamycin (CLEOCIN) IVPB 300 mg  Status:  Discontinued       Note to Pharmacy: To be given in specials   300 mg 100 mL/hr over 30 Minutes Intravenous  Once 04/08/21 1233 04/08/21 1537   04/08/21 1400  clindamycin (CLEOCIN) IVPB 300 mg  Status:  Discontinued        over 30 Minutes  Continuous PRN 04/08/21 1451 04/08/21 1537   04/08/21 1303  clindamycin (CLEOCIN) 300 MG/50ML IVPB       Note to Pharmacy: Genelle Bal   : cabinet override      04/08/21 1303 04/09/21 0114       Assessment/Plan: s/p Procedure(s): PERIPHERAL VASCULAR THROMBECTOMY (Left) Discharge POD #1 s/p LLE thrombectomy and IVC filter placement D/C Heparin gtt HLIV OOB ambulate as tolerated Begin Eliquis OK for Discharge- follow up in office in 2 weeks  LOS: 2 days    Jamesetta So A 04/09/2021

## 2021-04-09 NOTE — Consult Note (Signed)
ANTICOAGULATION CONSULT NOTE - Initial Consult  Pharmacy Consult for apixaban  Indication: DVT  Allergies  Allergen Reactions   Doxycycline Other (See Comments)    Unsure of exact reaction type   Penicillins Other (See Comments)    Did it involve swelling of the face/tongue/throat, SOB, or low BP? Unknown Did it involve sudden or severe rash/hives, skin peeling, or any reaction on the inside of your mouth or nose? Unknown Did you need to seek medical attention at a hospital or doctor's office? Yes When did it last happen? Childhood reaction at 75 years old       If all above answers are "NO", may proceed with cephalosporin use.     Patient Measurements: Height: 6' (182.9 cm) Weight: 90.7 kg (200 lb) IBW/kg (Calculated) : 77.6 Heparin Dosing Weight: 90.7kg  Vital Signs: Temp: 98.9 F (37.2 C) (10/01 0747) Temp Source: Oral (10/01 0747) BP: 110/50 (10/01 0747) Pulse Rate: 63 (10/01 0747)  Labs: Recent Labs    04/07/21 1230 04/07/21 1429 04/07/21 2119 04/08/21 0627 04/08/21 0849 04/09/21 0516  HGB 15.5  --   --  13.9  --  12.6*  HCT 43.5  --   --  39.5  --  34.4*  PLT 179  --   --  158  --  147*  APTT 32  --   --   --   --   --   LABPROT 13.8  --   --   --   --   --   INR 1.1  --   --   --   --   --   HEPARINUNFRC  --   --    < > >1.10* >1.10* 0.28*  CREATININE 1.41*  --   --  1.07  --  0.93  TROPONINIHS 3 2  --   --   --   --    < > = values in this interval not displayed.     Estimated Creatinine Clearance: 77.6 mL/min (by C-G formula based on SCr of 0.93 mg/dL).   Medical History: Past Medical History:  Diagnosis Date   AKI (acute kidney injury) ([place])    Anginal pain ([place])    AT REST. STRESS/ECHO 07/25/18   Cancer ([place])    SKIN    Chronic hypokalemia    SEEING [facility]   Dyspnea    DOE   GERD (gastroesophageal reflux disease)    History of kidney stones    Hypertension    Hypothyroidism    Neuropathy    FEET   Pre-diabetes      Assessment: 75yo M with PMH of for angina, HTN, pre-diabetes, cancer, hypothyroidism, and neuropathy who was admitted for extensive nonocclusive LLE DVT. Pt was initially started on heparin infusion, but is being transitioned to apixaban. Pt is s/p mechanical thrombectomy on 9/30.  Pharmacy was consulted for apixaban dosing.    Plan:  -Will discontinue the heparin drip and initiate apixaban 10mg  BID for 7 days followed by apixaban 5mg  BID thereafter -Will monitor CBC every 3 days while admitted on apixaban

## 2021-04-09 NOTE — Plan of Care (Signed)
Patient ID: Tyler Young, male   DOB: [birth year], 75 y.o.   MRN: [id number]  Problem: Education: Goal: Knowledge of General Education information will improve Description: Including pain rating scale, medication(s)/side effects and non-pharmacologic comfort measures Outcome: Adequate for Discharge   Problem: Health Behavior/Discharge Planning: Goal: Ability to manage health-related needs will improve Outcome: Adequate for Discharge   Problem: Clinical Measurements: Goal: Ability to maintain clinical measurements within normal limits will improve Outcome: Adequate for Discharge Goal: Will remain free from infection Outcome: Adequate for Discharge Goal: Diagnostic test results will improve Outcome: Adequate for Discharge Goal: Respiratory complications will improve Outcome: Adequate for Discharge Goal: Cardiovascular complication will be avoided Outcome: Adequate for Discharge   Problem: Activity: Goal: Risk for activity intolerance will decrease Outcome: Adequate for Discharge   Problem: Nutrition: Goal: Adequate nutrition will be maintained Outcome: Adequate for Discharge   Problem: Coping: Goal: Level of anxiety will decrease Outcome: Adequate for Discharge   Problem: Elimination: Goal: Will not experience complications related to bowel motility Outcome: Adequate for Discharge Goal: Will not experience complications related to urinary retention Outcome: Adequate for Discharge   Problem: Pain Managment: Goal: General experience of comfort will improve Outcome: Adequate for Discharge   Problem: Safety: Goal: Ability to remain free from injury will improve Outcome: Adequate for Discharge   Problem: Skin Integrity: Goal: Risk for impaired skin integrity will decrease Outcome: Adequate for Discharge    .memd

## 2021-04-11 ENCOUNTER — Encounter (INDEPENDENT_AMBULATORY_CARE_PROVIDER_SITE_OTHER): Payer: Self-pay

## 2021-04-11 ENCOUNTER — Encounter: Payer: Self-pay | Admitting: Vascular Surgery

## 2021-04-11 LAB — FACTOR 5 LEIDEN

## 2021-04-12 NOTE — Progress Notes (Signed)
Patient ID: Tyler Young, male   DOB: 07/20/1945, 75 y.o.   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  Patient on blood thinner already secondary to DVT and PE.  Likely will need long-term anticoagulation as long as he is not bleeding.  I called the patient and let him know.  Dr Loletha Grayer

## 2021-04-27 ENCOUNTER — Ambulatory Visit (INDEPENDENT_AMBULATORY_CARE_PROVIDER_SITE_OTHER): Payer: Medicare Other | Admitting: Nurse Practitioner

## 2021-04-27 ENCOUNTER — Other Ambulatory Visit (INDEPENDENT_AMBULATORY_CARE_PROVIDER_SITE_OTHER): Payer: Self-pay | Admitting: Vascular Surgery

## 2021-04-27 ENCOUNTER — Other Ambulatory Visit: Payer: Self-pay

## 2021-04-27 ENCOUNTER — Encounter (INDEPENDENT_AMBULATORY_CARE_PROVIDER_SITE_OTHER): Payer: Self-pay | Admitting: Nurse Practitioner

## 2021-04-27 ENCOUNTER — Ambulatory Visit (INDEPENDENT_AMBULATORY_CARE_PROVIDER_SITE_OTHER): Payer: Medicare Other

## 2021-04-27 VITALS — BP 126/79 | HR 76 | Resp 16 | Wt 200.0 lb

## 2021-04-27 DIAGNOSIS — I824Y2 Acute embolism and thrombosis of unspecified deep veins of left proximal lower extremity: Secondary | ICD-10-CM

## 2021-04-27 DIAGNOSIS — I2694 Multiple subsegmental pulmonary emboli without acute cor pulmonale: Secondary | ICD-10-CM

## 2021-04-27 DIAGNOSIS — Z95828 Presence of other vascular implants and grafts: Secondary | ICD-10-CM

## 2021-04-27 DIAGNOSIS — I82412 Acute embolism and thrombosis of left femoral vein: Secondary | ICD-10-CM

## 2021-04-27 DIAGNOSIS — I10 Essential (primary) hypertension: Secondary | ICD-10-CM

## 2021-04-27 DIAGNOSIS — E785 Hyperlipidemia, unspecified: Secondary | ICD-10-CM | POA: Diagnosis not present

## 2021-04-27 MED ORDER — APIXABAN 5 MG PO TABS: 5.0000 mg | ORAL_TABLET | Freq: Two times a day (BID) | ORAL | 11 refills | Status: DC

## 2021-05-02 ENCOUNTER — Encounter (INDEPENDENT_AMBULATORY_CARE_PROVIDER_SITE_OTHER): Payer: Self-pay | Admitting: Nurse Practitioner

## 2021-05-02 NOTE — Progress Notes (Signed)
Subjective:    Patient ID: Tyler Young, male    DOB: 03-27-1946, 75 y.o.   MRN: [id number] Chief Complaint  Patient presents with   Follow-up    [facility] 2wk post peripheral vascular thrombectomy     Tyler Young is a 75 year old male that presents today roughly 3 weeks following left lower extremity thrombectomy.  The patient presented to [facility] after having 3 days of pain and swelling.  Initially it was felt that this may be a unprovoked DVT however studies reveal factor V heterozygous factor.  Patient had an IVC filter placed during his thrombectomy.  He was also noted that he had multiple small pulmonary embolisms however there is no evidence of right heart strain or being severely symptomatic.  He still continues to have experience with left leg swelling.  Noninvasive studies show evidence of remaining thrombus in the left lower extremity.   Review of Systems  Cardiovascular:  Positive for leg swelling.  All other systems reviewed and are negative.     Objective:   Physical Exam Vitals reviewed.  HENT:     Head: Normocephalic.  Cardiovascular:     Rate and Rhythm: Normal rate.     Pulses: Normal pulses.  Pulmonary:     Effort: Pulmonary effort is normal.  Musculoskeletal:     Left lower leg: Edema present.  Neurological:     Mental Status: He is alert and oriented to person, place, and time.  Psychiatric:        Mood and Affect: Mood normal.        Behavior: Behavior normal.        Thought Content: Thought content normal.        Judgment: Judgment normal.    BP 126/79 (BP Location: Right Arm)   Pulse 76   Resp 16   Wt 200 lb (90.7 kg)   BMI 27.12 kg/m   Past Medical History:  Diagnosis Date   AKI (acute kidney injury) ([place])    Anginal pain ([place])    AT REST. STRESS/ECHO 07/25/18   Cancer ([employer])    SKIN    Chronic hypokalemia    SEEING DR [facility]   Dyspnea    DOE   GERD (gastroesophageal reflux disease)    History of kidney  stones    Hypertension    Hypothyroidism    Neuropathy    FEET   Pre-diabetes     Social History   Socioeconomic History   Marital status: Married    Spouse name: Not on file   Number of children: Not on file   Years of education: Not on file   Highest education level: Not on file  Occupational History   Not on file  Tobacco Use   Smoking status: Former    Types: Cigarettes    Quit date: 09/16/1989    Years since quitting: 31.6   Smokeless tobacco: Never  Vaping Use   Vaping Use: Never used  Substance and Sexual Activity   Alcohol use: Yes    Alcohol/week: 2.0 - 3.0 standard drinks    Types: 2 - 3 Cans of beer per week    Comment: WEEKENDS   Drug use: Not Currently   Sexual activity: Not on file  Other Topics Concern   Not on file  Social History Narrative   Not on file   Social Determinants of Health   Financial Resource Strain: Not on file  Food Insecurity: Not on file  Transportation Needs:  Not on file  Physical Activity: Not on file  Stress: Not on file  Social Connections: Not on file  Intimate Partner Violence: Not on file    Past Surgical History:  Procedure Laterality Date   COLONOSCOPY     EYE SURGERY     MYRINGOTOMY WITH TUBE PLACEMENT Bilateral 09/19/2018   Procedure: MYRINGOTOMY WITH TUBE PLACEMENT;  Surgeon: Carloyn Manner, MD;  Location: [facility] ORS;  Service: ENT;  Laterality: Bilateral;   NASAL POLYP EXCISION     PAROTIDECTOMY Right    PERIPHERAL VASCULAR THROMBECTOMY Left 04/08/2021   Procedure: PERIPHERAL VASCULAR THROMBECTOMY;  Surgeon: Katha Cabal, MD;  Location: [facility];  Service: Cardiovascular;  Laterality: Left;   THYROIDECTOMY, PARTIAL     TONSILLECTOMY      History reviewed. No pertinent family history.  Allergies  Allergen Reactions   Doxycycline Other (See Comments)    Unsure of exact reaction type   Penicillins Other (See Comments)    Did it involve swelling of the face/tongue/throat, SOB, or low BP?  Unknown Did it involve sudden or severe rash/hives, skin peeling, or any reaction on the inside of your mouth or nose? Unknown Did you need to seek medical attention at a hospital or doctor's office? Yes When did it last happen? Childhood reaction at 75 years old       If all above answers are "NO", may proceed with cephalosporin use.     CBC Latest Ref Rng & Units 04/09/2021 04/08/2021 04/07/2021  WBC 4.0 - 10.5 K/uL 8.2 8.6 12.7(H)  Hemoglobin 13.0 - 17.0 g/dL 12.6(L) 13.9 15.5  Hematocrit 39.0 - 52.0 % 34.4(L) 39.5 43.5  Platelets 150 - 400 K/uL 147(L) 158 179      CMP     Component Value Date/Time   NA 135 04/09/2021 0516   K 3.9 04/09/2021 0516   CL 105 04/09/2021 0516   CO2 23 04/09/2021 0516   GLUCOSE 125 (H) 04/09/2021 0516   BUN 16 04/09/2021 0516   CREATININE 0.93 04/09/2021 0516   CALCIUM 8.1 (L) 04/09/2021 0516   PROT 6.1 (L) 04/08/2021 0627   ALBUMIN 3.6 04/08/2021 0627   AST 17 04/08/2021 0627   ALT 18 04/08/2021 0627   ALKPHOS 51 04/08/2021 0627   BILITOT 1.0 04/08/2021 0627   GFRNONAA >60 04/09/2021 0516     No results found.     Assessment & Plan:   1. Acute deep vein thrombosis (DVT) of proximal vein of left lower extremity (HCC) Recommend:   Patient still continues to have evidence of acute thrombus post thrombectomy.  Currently the patient has an IVC filter in place.  He is tolerating Eliquis without issue.  We will have the patient return in 8 weeks for reevaluation of the thrombus.  Elevation was stressed, use of a recliner was discussed.  I have had a long discussion with the patient regarding DVT and post phlebitic changes such as swelling and why it  causes symptoms such as pain.  The patient will wear graduated compression stockings class 1 (20-30 mmHg), beginning after three full days of anticoagulation, on a daily basis a prescription was given. The patient will  beginning wearing the stockings first thing in the morning and removing them in  the evening. The patient is instructed specifically not to sleep in the stockings.  In addition, behavioral modification including elevation during the day and avoidance of prolonged dependency will be initiated.    The patient will continue anticoagulation for now  as there have not been any problems or complications at this point.       2. Essential hypertension Continue antihypertensive medications as already ordered, these medications have been reviewed and there are no changes at this time.   3. Hyperlipidemia, unspecified hyperlipidemia type Continue statin as ordered and reviewed, no changes at this time   4. Multiple subsegmental pulmonary emboli without acute cor pulmonale (HCC) The patient had multiple small pulmonary emboli however was not significant to warrant intervention at that time.  Currently he remains doing well this aspect.  Patient will continue to remain on Eliquis for bare minimum of 1 year but likely a lifetime.   Current Outpatient Medications on File Prior to Visit  Medication Sig Dispense Refill   acetaminophen (TYLENOL) 325 MG tablet Take 2 tablets (650 mg total) by mouth every 6 (six) hours as needed for mild pain, fever or headache.     amLODipine (NORVASC) 5 MG tablet Take 2.5 mg by mouth daily.     diphenhydrAMINE (SOMINEX) 25 MG tablet Take 25 mg by mouth at bedtime.     doxazosin (CARDURA) 2 MG tablet Take 2 mg by mouth every evening.     gabapentin (NEURONTIN) 100 MG capsule Take 200 mg by mouth at bedtime.     levothyroxine (SYNTHROID, LEVOTHROID) 125 MCG tablet Take 125 mcg by mouth daily before breakfast.     omeprazole (PRILOSEC) 20 MG capsule Take 20 mg by mouth every morning.     pravastatin (PRAVACHOL) 20 MG tablet Take 20 mg by mouth every evening.     spironolactone (ALDACTONE) 50 MG tablet Take 50 mg by mouth 2 (two) times daily.     triamcinolone (NASACORT) 55 MCG/ACT AERO nasal inhaler Place 1-2 sprays into the nose daily as needed (allergies).      VASCEPA 0.5 g CAPS Take 0.5 g by mouth daily.     PRESCRIPTION MEDICATION Apply 1 application topically 2 (two) times daily. Nifedipine 0.3% and lidocaine 2%     No current facility-administered medications on file prior to visit.    There are no Patient Instructions on file for this visit. No follow-ups on file.   Kris Hartmann, NP

## 2021-06-20 ENCOUNTER — Ambulatory Visit (INDEPENDENT_AMBULATORY_CARE_PROVIDER_SITE_OTHER): Payer: Medicare Other | Admitting: Vascular Surgery

## 2021-06-20 ENCOUNTER — Ambulatory Visit (INDEPENDENT_AMBULATORY_CARE_PROVIDER_SITE_OTHER): Payer: Medicare Other

## 2021-06-20 ENCOUNTER — Other Ambulatory Visit: Payer: Self-pay

## 2021-06-20 ENCOUNTER — Encounter (INDEPENDENT_AMBULATORY_CARE_PROVIDER_SITE_OTHER): Payer: Self-pay | Admitting: Vascular Surgery

## 2021-06-20 ENCOUNTER — Other Ambulatory Visit (INDEPENDENT_AMBULATORY_CARE_PROVIDER_SITE_OTHER): Payer: Self-pay | Admitting: Nurse Practitioner

## 2021-06-20 VITALS — BP 124/75 | HR 79 | Resp 16 | Wt 206.6 lb

## 2021-06-20 DIAGNOSIS — E785 Hyperlipidemia, unspecified: Secondary | ICD-10-CM

## 2021-06-20 DIAGNOSIS — I824Y2 Acute embolism and thrombosis of unspecified deep veins of left proximal lower extremity: Secondary | ICD-10-CM | POA: Diagnosis not present

## 2021-06-20 DIAGNOSIS — I10 Essential (primary) hypertension: Secondary | ICD-10-CM | POA: Diagnosis not present

## 2021-06-20 DIAGNOSIS — E039 Hypothyroidism, unspecified: Secondary | ICD-10-CM | POA: Diagnosis not present

## 2021-06-25 ENCOUNTER — Encounter (INDEPENDENT_AMBULATORY_CARE_PROVIDER_SITE_OTHER): Payer: Self-pay | Admitting: Vascular Surgery

## 2021-06-25 MED ORDER — APIXABAN 5 MG PO TABS: 5.0000 mg | ORAL_TABLET | Freq: Two times a day (BID) | ORAL | 4 refills | Status: DC

## 2021-06-25 NOTE — Progress Notes (Signed)
MRN : [id number]  Tyler Young is a 75 y.o. ([birth year]) male who presents with chief complaint of follow up for DVT.  History of Present Illness:    The patient presents to the office for follow-up evaluation status post left lower extremity thrombectomy.  Thrombectomy was performed at at [facility] on 04/08/2021.    Procedure 04/08/2021: Placement of a Denali IVC filter infrarenal  2.   Mechanical thrombectomy of the left popliteal, SFV and common femoral vein using the penumbra CAT 12 lightening catheter  The initial symptoms were pain and swelling in the left lower extremity.  The patient notes the leg is much improved although there is still some pain and swelling with prolonged dependency.  Symptoms are much better with elevation.  The patient notes minimal edema in the morning.    The patient has not been using compression therapy at this point.  No SOB or pleuritic chest pains.  No cough or hemoptysis.  No blood per rectum or blood in any sputum.  No excessive bruising per the patient.  The patient notes the leg continues to be very painful with dependency and swells quite a bite.  Symptoms are much better with elevation.  The patient notes minimal edema in the morning which steadily worsens throughout the day.    The patient has not been using compression therapy at this point.  No SOB or pleuritic chest pains.  No cough or hemoptysis.  No blood per rectum or blood in any sputum.  No excessive bruising per the patient.    Current Meds  Medication Sig   acetaminophen (TYLENOL) 325 MG tablet Take 2 tablets (650 mg total) by mouth every 6 (six) hours as needed for mild pain, fever or headache.   amLODipine (NORVASC) 5 MG tablet Take 2.5 mg by mouth daily.   apixaban (ELIQUIS) 5 MG TABS tablet Take 1 tablet (5 mg total) by mouth 2 (two) times daily. Take One tablet (5mg ) twice a day   diphenhydrAMINE (SOMINEX) 25 MG tablet Take 25 mg by mouth at bedtime.   doxazosin (CARDURA)  2 MG tablet Take 2 mg by mouth every evening.   gabapentin (NEURONTIN) 100 MG capsule Take 200 mg by mouth at bedtime.   levothyroxine (SYNTHROID, LEVOTHROID) 125 MCG tablet Take 125 mcg by mouth daily before breakfast.   omeprazole (PRILOSEC) 20 MG capsule Take 20 mg by mouth every morning.   pravastatin (PRAVACHOL) 20 MG tablet Take 20 mg by mouth every evening.   triamcinolone (NASACORT) 55 MCG/ACT AERO nasal inhaler Place 1-2 sprays into the nose daily as needed (allergies).   VASCEPA 0.5 g CAPS Take 0.5 g by mouth daily.    Past Medical History:  Diagnosis Date   AKI (acute kidney injury) ([place])    Anginal pain ([place])    AT REST. STRESS/ECHO 07/25/18   Cancer ([place])    SKIN    Chronic hypokalemia    SEEING [facility]   Dyspnea    DOE   GERD (gastroesophageal reflux disease)    History of kidney stones    Hypertension    Hypothyroidism    Neuropathy    FEET   Pre-diabetes     Past Surgical History:  Procedure Laterality Date   COLONOSCOPY     EYE SURGERY     MYRINGOTOMY WITH TUBE PLACEMENT Bilateral 09/19/2018   Procedure: MYRINGOTOMY WITH TUBE PLACEMENT;  Surgeon: Carloyn Manner, MD;  Location: [facility] ORS;  Service: ENT;  Laterality: Bilateral;  NASAL POLYP EXCISION     PAROTIDECTOMY Right    PERIPHERAL VASCULAR THROMBECTOMY Left 04/08/2021   Procedure: PERIPHERAL VASCULAR THROMBECTOMY;  Surgeon: Katha Cabal, MD;  Location: [facility];  Service: Cardiovascular;  Laterality: Left;   THYROIDECTOMY, PARTIAL     TONSILLECTOMY      Social History Social History   Tobacco Use   Smoking status: Former    Types: Cigarettes    Quit date: 09/16/1989    Years since quitting: 31.7   Smokeless tobacco: Never  Vaping Use   Vaping Use: Never used  Substance Use Topics   Alcohol use: Yes    Alcohol/week: 2.0 - 3.0 standard drinks    Types: 2 - 3 Cans of beer per week    Comment: WEEKENDS   Drug use: Not Currently    Family History History reviewed. No  pertinent family history.  Allergies  Allergen Reactions   Doxycycline Other (See Comments)    Unsure of exact reaction type   Penicillins Other (See Comments)    Did it involve swelling of the face/tongue/throat, SOB, or low BP? Unknown Did it involve sudden or severe rash/hives, skin peeling, or any reaction on the inside of your mouth or nose? Unknown Did you need to seek medical attention at a hospital or doctor's office? Yes When did it last happen? Childhood reaction at 75 years old       If all above answers are NO, may proceed with cephalosporin use.      REVIEW OF SYSTEMS (Negative unless checked)  Constitutional: [] Weight loss  [] Fever  [] Chills Cardiac: [] Chest pain   [] Chest pressure   [] Palpitations   [] Shortness of breath when laying flat   [] Shortness of breath with exertion. Vascular:  [] Pain in legs with walking   [] Pain in legs at rest  [] History of DVT   [] Phlebitis   [x] Swelling in legs   [] Varicose veins   [] Non-healing ulcers Pulmonary:   [] Uses home oxygen   [] Productive cough   [] Hemoptysis   [] Wheeze  [] COPD   [] Asthma Neurologic:  [] Dizziness   [] Seizures   [] History of stroke   [] History of TIA  [] Aphasia   [] Vissual changes   [] Weakness or numbness in arm   [] Weakness or numbness in leg Musculoskeletal:   [] Joint swelling   [] Joint pain   [] Low back pain Hematologic:  [] Easy bruising  [] Easy bleeding   [] Hypercoagulable state   [] Anemic Gastrointestinal:  [] Diarrhea   [] Vomiting  [] Gastroesophageal reflux/heartburn   [] Difficulty swallowing. Genitourinary:  [] Chronic kidney disease   [] Difficult urination  [] Frequent urination   [] Blood in urine Skin:  [] Rashes   [] Ulcers  Psychological:  [] History of anxiety   []  History of major depression.  Physical Examination  Vitals:   06/20/21 1436  BP: 124/75  Pulse: 79  Resp: 16  Weight: 206 lb 9.6 oz (93.7 kg)   Body mass index is 28.02 kg/m. Gen: WD/WN, NAD Head: [place]/AT, No temporalis wasting.   Ear/Nose/Throat: Hearing grossly intact, nares w/o erythema or drainage, pinna without lesions Eyes: PER, EOMI, sclera nonicteric.  Neck: Supple, no gross masses.  No JVD.  Pulmonary:  Good air movement, no audible wheezing, no use of accessory muscles.  Cardiac: RRR, precordium not hyperdynamic. Vascular:  scattered varicosities present bilaterally.  Mild venous stasis changes to the legs bilaterally.  1+ soft pitting edema left  which is about the same as the right Vessel Right Left  Radial Palpable Palpable  Gastrointestinal: soft, non-distended. No  guarding/no peritoneal signs.  Musculoskeletal: M/S 5/5 throughout.  No deformity.  Neurologic: CN 2-12 intact. Pain and light touch intact in extremities.  Symmetrical.  Speech is fluent. Motor exam as listed above. Psychiatric: Judgment intact, Mood & affect appropriate for pt's clinical situation. Dermatologic: Venous rashes no ulcers noted.  No changes consistent with cellulitis. Lymph : No lichenification or skin changes of chronic lymphedema.  CBC Lab Results  Component Value Date   WBC 8.2 04/09/2021   HGB 12.6 (L) 04/09/2021   HCT 34.4 (L) 04/09/2021   MCV 90.5 04/09/2021   PLT 147 (L) 04/09/2021    BMET    Component Value Date/Time   NA 135 04/09/2021 0516   K 3.9 04/09/2021 0516   CL 105 04/09/2021 0516   CO2 23 04/09/2021 0516   GLUCOSE 125 (H) 04/09/2021 0516   BUN 16 04/09/2021 0516   CREATININE 0.93 04/09/2021 0516   CALCIUM 8.1 (L) 04/09/2021 0516   GFRNONAA >60 04/09/2021 0516   CrCl cannot be calculated (Patient's most recent lab result is older than the maximum 21 days allowed.).  COAG Lab Results  Component Value Date   INR 1.1 04/07/2021    Radiology VAS [place] LOWER EXTREMITY VENOUS (DVT)  Result Date: 06/23/2021  Lower Venous DVT Study Patient Name:  MONROE QIN Dutko  Date of Exam:   06/20/2021 Medical Rec #: [id number]        Accession #:    [id number] Date of Birth: 06-01-1946       Patient Gender:  M Patient Age:   56 years Exam Location:  [facility] Procedure:      VAS [place] LOWER EXTREMITY VENOUS (DVT) Referring Phys: Eulogio Ditch --------------------------------------------------------------------------------  Indications: Pain.  Risk Factors: DVT left. Anticoagulation: Eliquis. Comparison Study: 04/27/2021 Performing Technologist: Concha Norway RVT  Examination Guidelines: A complete evaluation includes B-mode imaging, spectral Doppler, color Doppler, and power Doppler as needed of all accessible portions of each vessel. Bilateral testing is considered an integral part of a complete examination. Limited examinations for reoccurring indications may be performed as noted. The reflux portion of the exam is performed with the patient in reverse Trendelenburg.  +-----+---------------+---------+-----------+----------+--------------+  RIGHT Compressibility Phasicity Spontaneity Properties Thrombus Aging  +-----+---------------+---------+-----------+----------+--------------+  CFV   Full            Yes       Yes                                    +-----+---------------+---------+-----------+----------+--------------+  SFJ   Full            Yes       Yes                                    +-----+---------------+---------+-----------+----------+--------------+   +---------+---------------+---------+-----------+---------------+--------------+  LEFT      Compressibility Phasicity Spontaneity Properties      Thrombus Aging  +---------+---------------+---------+-----------+---------------+--------------+  CFV       Full            Yes       Yes                                         +---------+---------------+---------+-----------+---------------+--------------+  SFJ  Full            Yes       Yes                                         +---------+---------------+---------+-----------+---------------+--------------+  FV Prox   None                                                                   +---------+---------------+---------+-----------+---------------+--------------+  FV Mid    None                                                                  +---------+---------------+---------+-----------+---------------+--------------+  FV Distal Partial                               partially                                                                        re-cannalized                   +---------+---------------+---------+-----------+---------------+--------------+  PFV       Full            Yes       Yes                                         +---------+---------------+---------+-----------+---------------+--------------+  POP       Partial                               partially                                                                        re-cannalized                   +---------+---------------+---------+-----------+---------------+--------------+  PTV       Partial                               partially  re-cannalized                   +---------+---------------+---------+-----------+---------------+--------------+  PERO      Full            Yes       Yes                                         +---------+---------------+---------+-----------+---------------+--------------+  Gastroc   Full            Yes       Yes                                         +---------+---------------+---------+-----------+---------------+--------------+  GSV       Full            Yes       Yes                                         +---------+---------------+---------+-----------+---------------+--------------+  SSV       Full                                                                  +---------+---------------+---------+-----------+---------------+--------------+     Summary: RIGHT: - No evidence of common femoral vein obstruction.  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 The distal SFV and popliteal veins shows some recannelized flow and is partially compressible. The PTV is paired and shows one occluded vessel and one widely patent. The peroneal is normal.  *See table(s) above for measurements and observations. Electronically signed by Hortencia Pilar MD on 06/23/2021 at 5:26:26 PM.    Final      Assessment/Plan 1. Acute deep vein thrombosis (DVT) of proximal vein of left lower extremity (HCC) Recommend:   No surgery or intervention at this point in time.  IVC filter is in position and we discussed removal once product becomes available.  Risk and benefits were reviewed the patient.  Indications for the procedure were reviewed.  All questions were answered, the patient agrees to proceed.    Patient's duplex ultrasound of the venous system shows significant improvement DVT from the popliteal to the femoral veins.  The patient is on anticoagulation   Elevation was stressed, use of a recliner was discussed.  I have had a long discussion with the patient regarding DVT and post phlebitic changes such as swelling and why it  causes symptoms such as pain.  The patient will wear graduated compression stockings class 1 (20-30 mmHg), beginning after three full days of anticoagulation, on a daily basis a prescription was given. The patient will  beginning wearing the stockings first thing in the morning and removing them in the evening. The patient is instructed specifically not to sleep in the stockings.  In addition, behavioral modification including elevation during the day and avoidance of prolonged dependency will be initiated.  The patient will continue anticoagulation for now as there have not been any problems or complications at this point.    A total of 30 minutes was spent with this patient and greater than 50% was spent in counseling and coordination of care with the patient.  Discussion included the treatment options for vascular  disease including indications for surgery and intervention.  Also discussed is the appropriate timing of treatment.  In addition medical therapy was discussed.    - VAS [place] LOWER EXTREMITY VENOUS (DVT); Future  2. Essential hypertension Continue antihypertensive medications as already ordered, these medications have been reviewed and there are no changes at this time.   3. Hypothyroidism, unspecified type Continue hormone replacement as ordered and reviewed, no changes at this time   4. Hyperlipidemia, unspecified hyperlipidemia type Continue statin as ordered and reviewed, no changes at this time     Hortencia Pilar, MD  06/25/2021 2:27 PM

## 2021-10-20 ENCOUNTER — Ambulatory Visit (INDEPENDENT_AMBULATORY_CARE_PROVIDER_SITE_OTHER): Payer: Medicare Other | Admitting: Vascular Surgery

## 2021-10-20 ENCOUNTER — Encounter (INDEPENDENT_AMBULATORY_CARE_PROVIDER_SITE_OTHER): Payer: Medicare Other

## 2021-11-08 IMAGING — US US RENAL
1 series · 14 of 25 positions shown · non-contrast
Comparison: Prior ultrasound from 04/24/2018.

CLINICAL DATA: Initial evaluation for kidney stone.

EXAM:
RENAL / URINARY TRACT ULTRASOUND COMPLETE

[Series 1: us renal · 0.26mm/px · 14 of 39 slices shown]
[im 1/39]
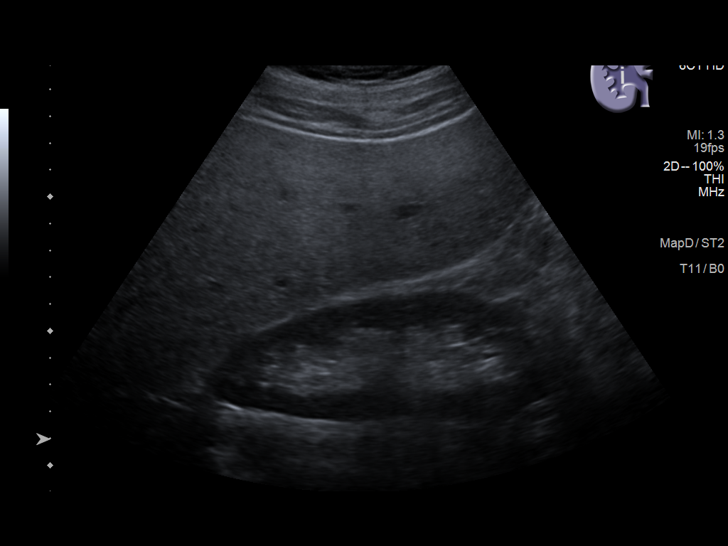
[im 4/39]
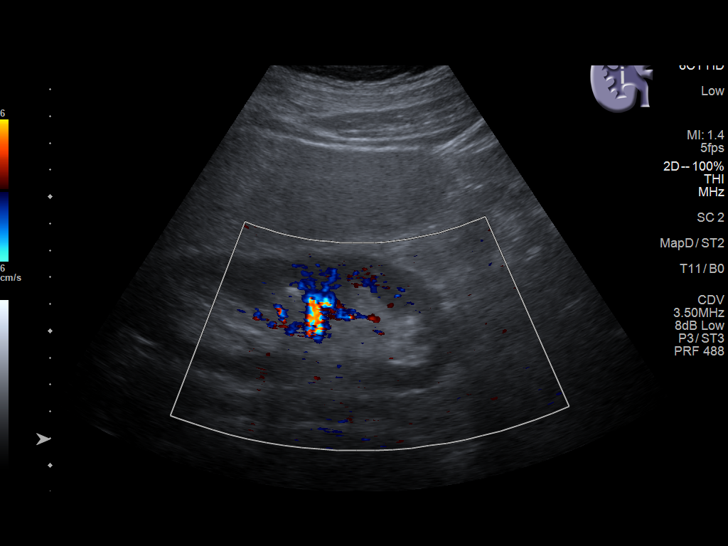
[im 7/39]
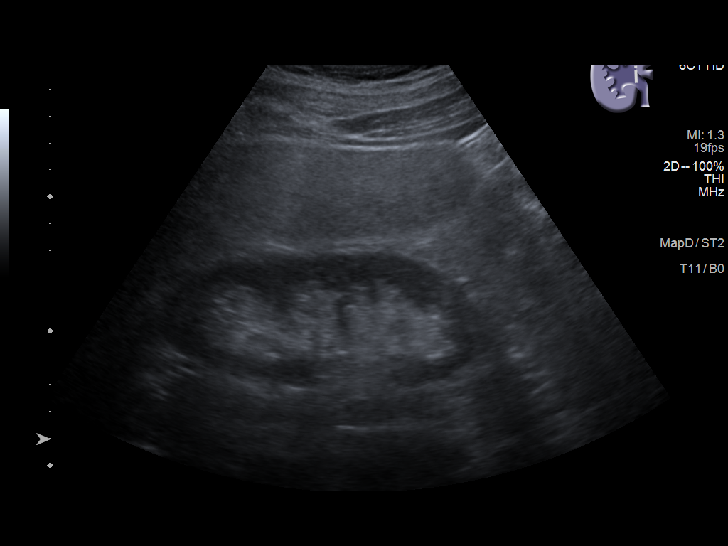
[im 10/39]
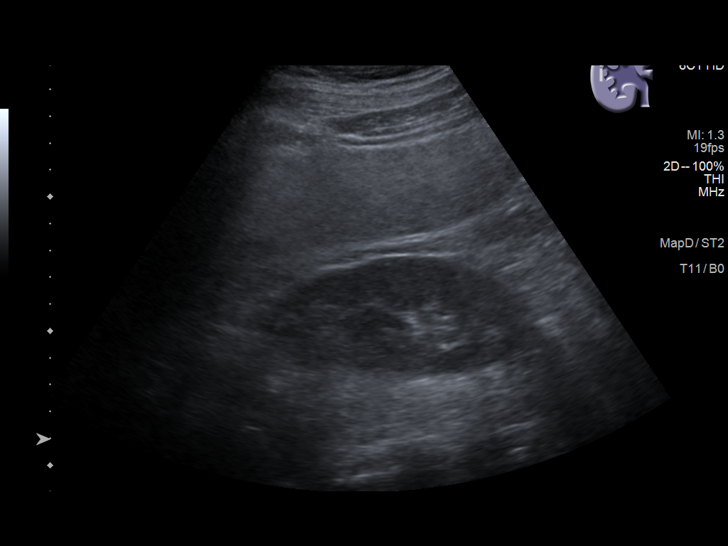
[im 13/39]
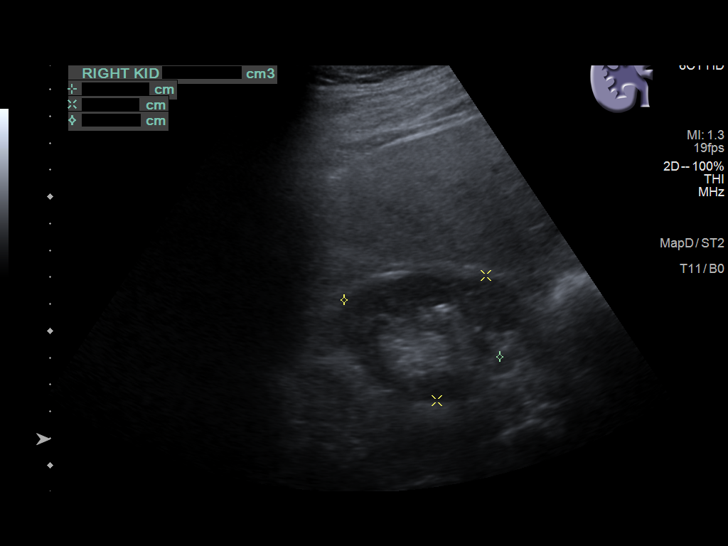
[im 15/39]
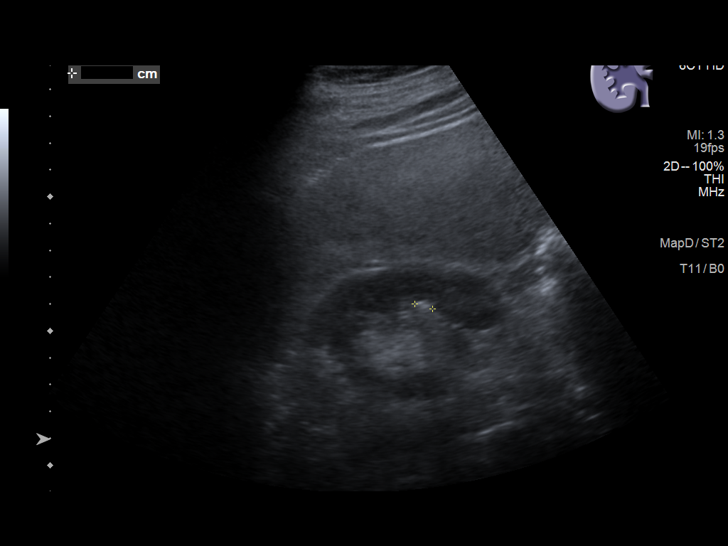
[im 18/39]
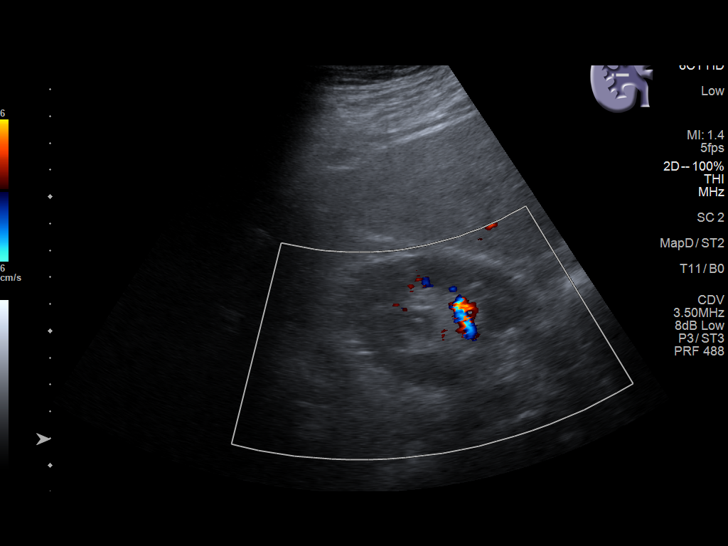
[im 21/39]
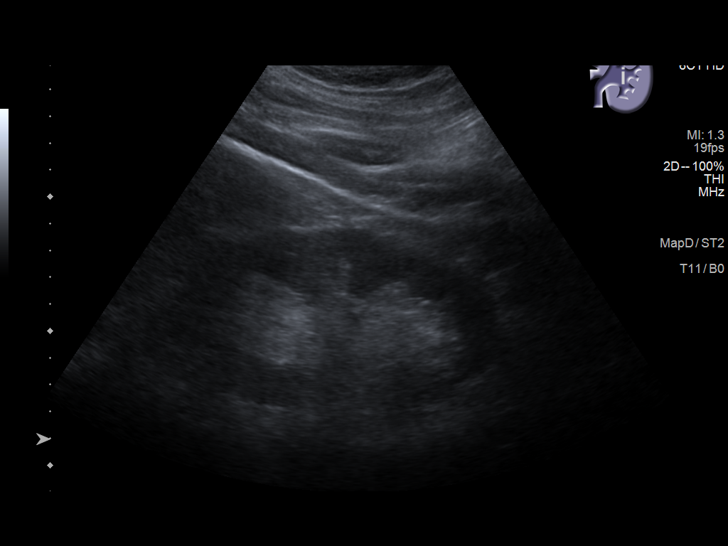
[im 24/39]
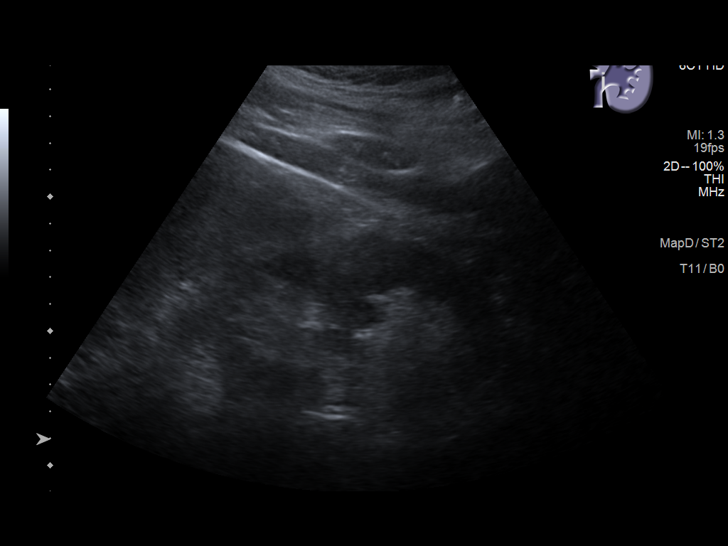
[im 26/39]
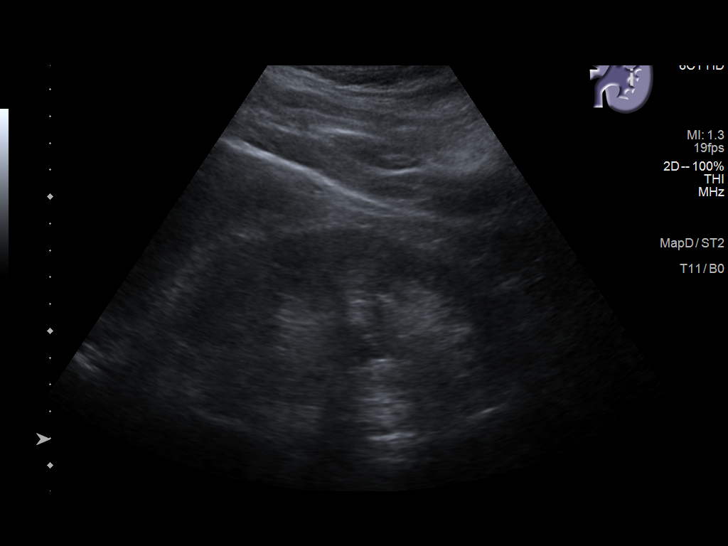
[im 29/39]
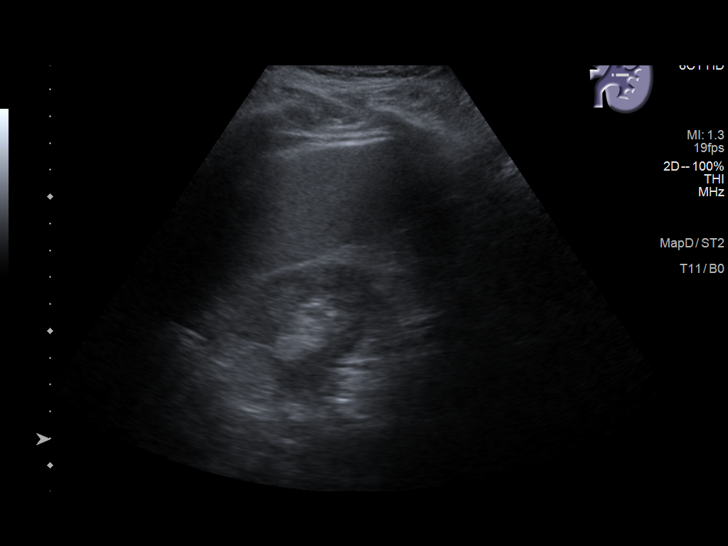
[im 32/39]
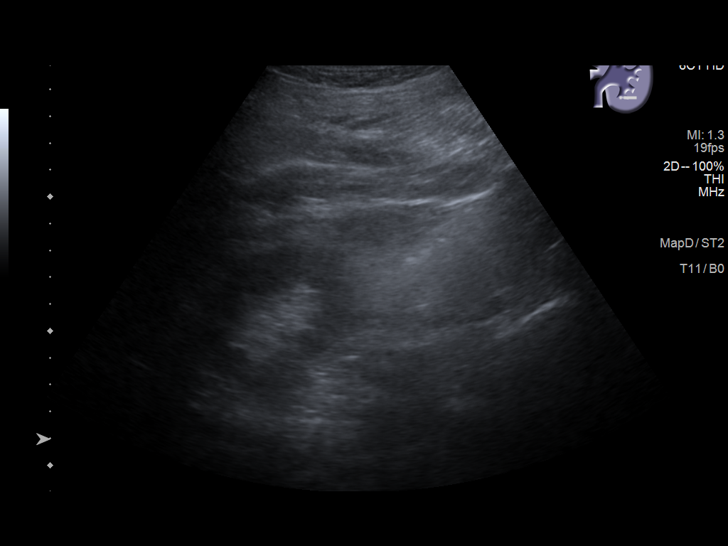
[im 35/39]
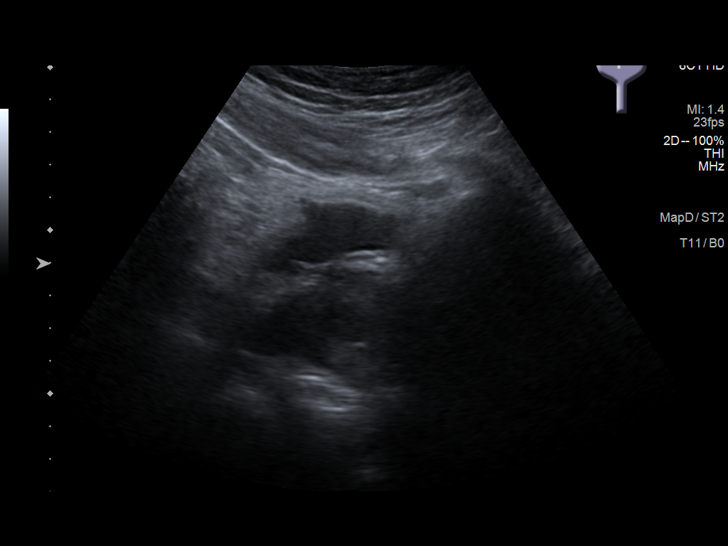
[im 39/39]
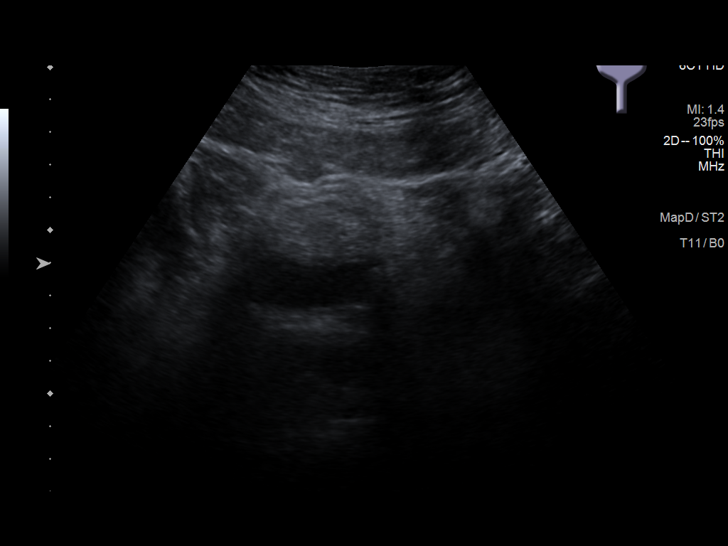

[14 of 25 positions shown; findings below may reference images not displayed]

FINDINGS: Right Kidney:

Renal measurements: 13.5 x 5.0 x 6.2 cm = volume: 219 mL.
Echogenicity within normal limits. No mass or hydronephrosis
visualized. 7 mm shadowing nonobstructive stone present within the
interpolar region.

Left Kidney:

Renal measurements: 12.4 x 4.2 x 5.3 cm = volume: 144 mL.
Echogenicity within normal limits. No mass or hydronephrosis
visualized. No sonographic evidence for nephrolithiasis.

Bladder:

Appears normal for degree of bladder distention.

Other:

None.
IMPRESSION: 1. Single 7 mm nonobstructive stone within the interpolar right
kidney.
2. No hydronephrosis or obstructive uropathy.

## 2021-11-15 ENCOUNTER — Telehealth (INDEPENDENT_AMBULATORY_CARE_PROVIDER_SITE_OTHER): Payer: Self-pay

## 2021-11-15 ENCOUNTER — Encounter (INDEPENDENT_AMBULATORY_CARE_PROVIDER_SITE_OTHER): Payer: Self-pay

## 2021-11-15 ENCOUNTER — Encounter: Payer: Self-pay | Admitting: Internal Medicine

## 2021-11-15 NOTE — Telephone Encounter (Signed)
Spoke with the patient and he is scheduled with Dr. Delana Meyer for a IVC filter removal on 11/22/21 with a  8:30 am arrival time to the MM. Pre-procedure instructions were discussed and will be mailed. ?

## 2021-11-16 ENCOUNTER — Ambulatory Visit
Admission: RE | Admit: 2021-11-16 | Discharge: 2021-11-16 | Disposition: A | Discharge status: Home / Self Care | Payer: Medicare Other | Source: Ambulatory Visit | Attending: Internal Medicine | Admitting: Internal Medicine

## 2021-11-16 ENCOUNTER — Encounter
Admission: RE | Disposition: A | Discharge status: Home / Self Care | Payer: Self-pay | Source: Ambulatory Visit | Attending: Internal Medicine

## 2021-11-16 ENCOUNTER — Ambulatory Visit: Payer: Medicare Other | Admitting: Anesthesiology

## 2021-11-16 ENCOUNTER — Encounter: Payer: Self-pay | Admitting: Internal Medicine

## 2021-11-16 DIAGNOSIS — D120 Benign neoplasm of cecum: Secondary | ICD-10-CM | POA: Insufficient documentation

## 2021-11-16 DIAGNOSIS — Z85828 Personal history of other malignant neoplasm of skin: Secondary | ICD-10-CM | POA: Diagnosis not present

## 2021-11-16 DIAGNOSIS — I10 Essential (primary) hypertension: Secondary | ICD-10-CM | POA: Diagnosis not present

## 2021-11-16 DIAGNOSIS — Z1211 Encounter for screening for malignant neoplasm of colon: Secondary | ICD-10-CM | POA: Insufficient documentation

## 2021-11-16 DIAGNOSIS — D122 Benign neoplasm of ascending colon: Secondary | ICD-10-CM | POA: Insufficient documentation

## 2021-11-16 DIAGNOSIS — E039 Hypothyroidism, unspecified: Secondary | ICD-10-CM | POA: Diagnosis not present

## 2021-11-16 DIAGNOSIS — K640 First degree hemorrhoids: Secondary | ICD-10-CM | POA: Insufficient documentation

## 2021-11-16 DIAGNOSIS — Z86718 Personal history of other venous thrombosis and embolism: Secondary | ICD-10-CM | POA: Diagnosis not present

## 2021-11-16 DIAGNOSIS — G629 Polyneuropathy, unspecified: Secondary | ICD-10-CM | POA: Diagnosis not present

## 2021-11-16 DIAGNOSIS — K5730 Diverticulosis of large intestine without perforation or abscess without bleeding: Secondary | ICD-10-CM | POA: Insufficient documentation

## 2021-11-16 DIAGNOSIS — K219 Gastro-esophageal reflux disease without esophagitis: Secondary | ICD-10-CM | POA: Insufficient documentation

## 2021-11-16 DIAGNOSIS — R195 Other fecal abnormalities: Secondary | ICD-10-CM | POA: Diagnosis not present

## 2021-11-16 DIAGNOSIS — Z87891 Personal history of nicotine dependence: Secondary | ICD-10-CM | POA: Diagnosis not present

## 2021-11-16 DIAGNOSIS — E785 Hyperlipidemia, unspecified: Secondary | ICD-10-CM | POA: Insufficient documentation

## 2021-11-16 HISTORY — DX: Heart disease, unspecified: I51.9

## 2021-11-16 HISTORY — DX: Rheumatic fever without heart involvement: I00

## 2021-11-16 HISTORY — PX: COLONOSCOPY WITH PROPOFOL: SHX5780

## 2021-11-16 HISTORY — DX: Hyperlipidemia, unspecified: E78.5

## 2021-11-16 SURGERY — COLONOSCOPY
Anesthesia: General

## 2021-11-16 SURGERY — COLONOSCOPY WITH PROPOFOL
Anesthesia: General

## 2021-11-16 MED ORDER — LIDOCAINE HCL (CARDIAC) PF 100 MG/5ML IV SOSY
PREFILLED_SYRINGE | INTRAVENOUS | Status: DC | PRN
  Administered 2021-11-16: 60 mg via INTRAVENOUS

## 2021-11-16 MED ORDER — SODIUM CHLORIDE 0.9 % IV SOLN: INTRAVENOUS | Status: DC

## 2021-11-16 MED ORDER — PROPOFOL 500 MG/50ML IV EMUL
INTRAVENOUS | Status: DC | PRN
  Administered 2021-11-16: 125 ug/kg/min via INTRAVENOUS

## 2021-11-16 MED ORDER — PROPOFOL 10 MG/ML IV BOLUS
INTRAVENOUS | Status: DC | PRN
  Administered 2021-11-16: 70 mg via INTRAVENOUS

## 2021-11-16 NOTE — Interval H&P Note (Signed)
History and Physical Interval Note: ? ?11/16/2021 ?2:17 PM ? ?Tyler Young  has presented today for surgery, with the diagnosis of + Cologuard.  The various methods of treatment have been discussed with the patient and family. After consideration of risks, benefits and other options for treatment, the patient has consented to  Procedure(s): ?COLONOSCOPY WITH PROPOFOL (N/A) as a surgical intervention.  The patient's history has been reviewed, patient examined, no change in status, stable for surgery.  I have reviewed the patient's chart and labs.  Questions were answered to the patient's satisfaction.   ? ? ?[place], [place] ? ? ?

## 2021-11-16 NOTE — H&P (Signed)
Outpatient short stay form Pre-procedure ?11/16/2021 2:16 PM ?Danise Dehne K. Alice Reichert, M.D. ? ?Primary Physician: Tracie Harrier, M.D. ? ?Reason for visit:  Positive Cologuard test ? ?History of present illness:  Patient is a pleasant 76 y/o male/male presenting on referral from primary care provider for a POSITIVE Cologuard result. Patient denies change in bowel habits, rectal bleeding, weight loss or abdominal pain.    ? ? ? ?Current Facility-Administered Medications:  ?  0.9 %  sodium chloride infusion, , Intravenous, Continuous, Warren, Benay Pike, MD, Last Rate: 20 mL/hr at 11/16/21 1325, New Bag at 11/16/21 1325 ? ?Medications Prior to Admission  ?Medication Sig Dispense Refill Last Dose  ? amLODipine (NORVASC) 5 MG tablet Take 2.5 mg by mouth daily.   11/16/2021 at 0945  ? eplerenone (INSPRA) 50 MG tablet Take 50 mg by mouth daily.   11/16/2021 at 0945  ? fenofibrate (TRICOR) 48 MG tablet Take 48 mg by mouth daily.   11/16/2021 at 0945  ? gabapentin (NEURONTIN) 100 MG capsule Take 200 mg by mouth at bedtime.   11/15/2021  ? levothyroxine (SYNTHROID, LEVOTHROID) 125 MCG tablet Take 125 mcg by mouth daily before breakfast.   11/16/2021  ? Sod Picosulfate-Mag Ox-Cit Acd (CLENPIQ) 10-3.5-12 MG-GM -GM/160ML SOLN Take by mouth.     ? spironolactone (ALDACTONE) 50 MG tablet Take 50 mg by mouth 2 (two) times daily.   11/15/2021  ? acetaminophen (TYLENOL) 325 MG tablet Take 2 tablets (650 mg total) by mouth every 6 (six) hours as needed for mild pain, fever or headache.     ? apixaban (ELIQUIS) 5 MG TABS tablet Take 1 tablet (5 mg total) by mouth 2 (two) times daily. Take One tablet ('5mg'$ ) twice a day 60 tablet 4 11/13/2021  ? diphenhydrAMINE (SOMINEX) 25 MG tablet Take 25 mg by mouth at bedtime.     ? doxazosin (CARDURA) 2 MG tablet Take 2 mg by mouth every evening.     ? omeprazole (PRILOSEC) 20 MG capsule Take 20 mg by mouth every morning.     ? pravastatin (PRAVACHOL) 20 MG tablet Take 20 mg by mouth every evening.     ?  PRESCRIPTION MEDICATION Apply 1 application topically 2 (two) times daily. Nifedipine 0.3% and lidocaine 2%     ? triamcinolone (NASACORT) 55 MCG/ACT AERO nasal inhaler Place 1-2 sprays into the nose daily as needed (allergies).     ? VASCEPA 0.5 g CAPS Take 0.5 g by mouth daily.     ? ? ? ?Allergies  ?Allergen Reactions  ? Doxycycline Other (See Comments)  ?  Unsure of exact reaction type  ? Penicillins Other (See Comments)  ?  Did it involve swelling of the face/tongue/throat, SOB, or low BP? Unknown ?Did it involve sudden or severe rash/hives, skin peeling, or any reaction on the inside of your mouth or nose? Unknown ?Did you need to seek medical attention at a hospital or doctor's office? Yes ?When did it last happen? Childhood reaction at 76 years old       ?If all above answers are ?NO?, may proceed with cephalosporin use. ?  ? ? ? ?Past Medical History:  ?Diagnosis Date  ? AKI (acute kidney injury) ([place])   ? Anginal pain ([place])   ? AT REST. STRESS/ECHO 07/25/18  ? Cancer [facility])   ? SKIN   ? Chronic hypokalemia   ? SEEING DR [facility]  ? Dyspnea   ? DOE  ? GERD (gastroesophageal reflux disease)   ? History of  kidney stones   ? HLD (hyperlipidemia)   ? Hypertension   ? Hypothyroidism   ? Neuropathy   ? FEET  ? Pre-diabetes   ? Rheumatic fever   ? Ventricular dysfunction   ? 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 Clayborn Bigness  ? ? ?Review of systems:  Otherwise negative.  ? ? ?Physical Exam ? ?Gen: Alert, oriented. Appears stated age.  ?HEENT: [place]/AT. PERRLA. ?Lungs: CTA, no wheezes. ?CV: RR nl S1, S2. ?Abd: soft, benign, no masses. BS+ ?Ext: No edema. Pulses 2+ ? ? ? ?Planned procedures: Proceed with colonoscopy. The patient understands the nature of the planned procedure, indications, risks, alternatives and potential complications including but not limited to bleeding, infection, perforation, damage to internal organs and possible oversedation/side effects from anesthesia. The patient agrees and gives consent to  proceed.  ?Please refer to procedure notes for findings, recommendations and patient disposition/instructions.  ? ? ? ?Vickye Astorino K. Alice Reichert, M.D. ?Gastroenterology ?11/16/2021  2:16 PM ? ? ? ? ? ? ?

## 2021-11-16 NOTE — Op Note (Signed)
[facility] ?Gastroenterology ?Patient Name: Tyler Young ?Procedure Date: 11/16/2021 2:13 PM ?MRN: [id number] ?Account #: [id number] ?Date of Birth: 02-26-1946 ?Admit Type: Outpatient ?Age: 76 ?Room: [facility] ENDO ROOM 2 ?Gender: Male ?Note Status: Finalized ?Instrument Name: Colonscope [id number] ?Procedure:             Colonoscopy ?Indications:           Positive Cologuard test ?Providers:             Benay Pike. Alice Reichert MD, MD ?Referring MD:          Tracie Harrier, MD (Referring MD) ?Medicines:             Propofol per Anesthesia ?Complications:         No immediate complications. ?Procedure:             Pre-Anesthesia Assessment: ?                       - The risks and benefits of the procedure and the  ?                       sedation options and risks were discussed with the  ?                       patient. All questions were answered and informed  ?                       consent was obtained. ?                       - Patient identification and proposed procedure were  ?                       verified prior to the procedure by the nurse. The  ?                       procedure was verified in the procedure room. ?                       - ASA Grade Assessment: III - A patient with severe  ?                       systemic disease. ?                       - After reviewing the risks and benefits, the patient  ?                       was deemed in satisfactory condition to undergo the  ?                       procedure. ?                       After obtaining informed consent, the colonoscope was  ?                       passed under direct vision. Throughout the procedure,  ?                       the patient's blood  pressure, pulse, and oxygen  ?                       saturations were monitored continuously. The  ?                       Colonoscope was introduced through the anus and  ?                       advanced to the the cecum, identified by appendiceal  ?                       orifice and  ileocecal valve. The colonoscopy was  ?                       performed without difficulty. The patient tolerated  ?                       the procedure well. The quality of the bowel  ?                       preparation was adequate. The ileocecal valve,  ?                       appendiceal orifice, and rectum were photographed. ?Findings: ?     The perianal and digital rectal examinations were normal. Pertinent  ?     negatives include normal sphincter tone and no palpable rectal lesions. ?     Non-bleeding internal hemorrhoids were found during retroflexion. The  ?     hemorrhoids were Grade I (internal hemorrhoids that do not prolapse). ?     Many medium-mouthed diverticula were found in the sigmoid colon. ?     Two sessile polyps were found in the ascending colon and cecum. The  ?     polyps were 4 to 5 mm in size. These polyps were removed with a jumbo  ?     cold forceps. Resection and retrieval were complete. ?     The exam was otherwise without abnormality. ?Impression:            - Non-bleeding internal hemorrhoids. ?                       - Diverticulosis in the sigmoid colon. ?                       - Two 4 to 5 mm polyps in the ascending colon and in  ?                       the cecum, removed with a jumbo cold forceps. Resected  ?                       and retrieved. ?                       - The examination was otherwise normal. ?Recommendation:        - Patient has a contact number available for  ?                       emergencies. The signs and symptoms  of potential  ?                       delayed complications were discussed with the patient.  ?                       Return to normal activities tomorrow. Written  ?                       discharge instructions were provided to the patient. ?                       - Resume previous diet. ?                       - Continue present medications. ?                       - If polyps are benign or adenomatous without  ?                       dysplasia, I  will advise NO further colonoscopy due to  ?                       advanced age and/or severe comorbidity. ?                       - Return to GI office PRN. ?                       - The findings and recommendations were discussed with  ?                       the patient. ?Procedure Code(s):     --- Professional --- ?                       [phone], Colonoscopy, flexible; with biopsy, single or  ?                       multiple ?Diagnosis Code(s):     --- Professional --- ?                       K57.30, Diverticulosis of large intestine without  ?                       perforation or abscess without bleeding ?                       R19.5, Other fecal abnormalities ?                       K63.5, Polyp of colon ?                       K64.0, First degree hemorrhoids ?CPT copyright 2019 American Medical Association. All rights reserved. ?The codes documented in this report are preliminary and upon coder review may  ?be revised to meet current compliance requirements. ?Efrain Sella MD, MD ?11/16/2021 2:45:28 PM ?This report has been signed electronically. ?Number of Addenda: 0 ?Note Initiated On: 11/16/2021 2:13 PM ?Scope Withdrawal Time: 0 hours 7  minutes 9 seconds  ?Total Procedure Duration: 0 hours 13 minutes 10 seconds  ?Estimated Blood Loss:  Estimated blood loss: none. ?     [facility] ?

## 2021-11-16 NOTE — Anesthesia Preprocedure Evaluation (Signed)
Anesthesia Evaluation  ?Patient identified by MRN, date of birth, ID band ?Patient awake ? ? ? ?Reviewed: ?Allergy & Precautions, NPO status , Patient's Chart, lab work & pertinent test results ? ?History of Anesthesia Complications ?Negative for: history of anesthetic complications ? ?Airway ?Mallampati: III ? ?TM Distance: >3 FB ?Neck ROM: Full ? ? ? Dental ?no notable dental hx. ?(+) Teeth Intact ?  ?Pulmonary ?neg pulmonary ROS, neg sleep apnea, neg COPD, Patient abstained from smoking.Not current smoker, former smoker,  ?  ?Pulmonary exam normal ?breath sounds clear to auscultation ? ? ? ? ? ? Cardiovascular ?Exercise Tolerance: Good ?METShypertension, (-) angina+ DVT  ?(-) CAD and (-) Past MI (-) dysrhythmias  ?Rhythm:Regular Rate:Normal ?- Systolic murmurs ??1. Left ventricular ejection fraction, by estimation, is 60 to 65%. The  ?left ventricle has normal function. Left ventricular endocardial border  ?not optimally defined to evaluate regional wall motion. There is mild left  ?ventricular hypertrophy. Left  ?ventricular diastolic parameters are consistent with Grade I diastolic  ?dysfunction (impaired relaxation).  ??2. Right ventricular systolic function is normal. The right ventricular  ?size is normal. Tricuspid regurgitation signal is inadequate for assessing  ?PA pressure.  ??3. Left atrial size was mildly dilated.  ??4. Right atrial size was mildly dilated.  ??5. The mitral valve was not well visualized. No evidence of mitral valve  ?regurgitation. No evidence of mitral stenosis.  ??6. The aortic valve is tricuspid. There is mild calcification of the  ?aortic valve. There is mild thickening of the aortic valve. Aortic valve  ?regurgitation is not visualized. No aortic stenosis is present.  ?  ?Neuro/Psych ?negative neurological ROS ? negative psych ROS  ? GI/Hepatic ?GERD  Medicated and Controlled,(+)  ?  ? (-) substance abuse ? ,   ?Endo/Other  ?neg  diabetesHypothyroidism  ? Renal/GU ?negative Renal ROS  ? ?  ?Musculoskeletal ? ? Abdominal ?  ?Peds ? Hematology ?  ?Anesthesia Other Findings ?Past Medical History: ?No date: AKI (acute kidney injury) ([place]) ?No date: Anginal pain ([place]) ?    Comment:  AT REST. STRESS/ECHO 07/25/18 ?No date: Cancer [facility]) ?    Comment:  SKIN  ?No date: Chronic hypokalemia ?    Comment:  SEEING DR [facility] ?No date: Dyspnea ?    Comment:  DOE ?No date: GERD (gastroesophageal reflux disease) ?No date: History of kidney stones ?No date: HLD (hyperlipidemia) ?No date: Hypertension ?No date: Hypothyroidism ?No date: Neuropathy ?    Comment:  FEET ?No date: Pre-diabetes ?No date: Rheumatic fever ?No date: Ventricular dysfunction ?    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 Clayborn Bigness ? Reproductive/Obstetrics ? ?  ? ? ? ? ? ? ? ? ? ? ? ? ? ?  ?  ? ? ? ? ? ? ? ? ?Anesthesia Physical ?Anesthesia Plan ? ?ASA: 2 ? ?Anesthesia Plan: General  ? ?Post-op Pain Management: Minimal or no pain anticipated  ? ?Induction: Intravenous ? ?PONV Risk Score and Plan: 2 and Propofol infusion, TIVA and Ondansetron ? ?Airway Management Planned: Nasal Cannula ? ?Additional Equipment: None ? ?Intra-op Plan:  ? ?Post-operative Plan:  ? ?Informed Consent: I have reviewed the patients History and Physical, chart, labs and discussed the procedure including the risks, benefits and alternatives for the proposed anesthesia with the patient or authorized representative who has indicated his/her understanding and acceptance.  ? ? ? ?Dental advisory given ? ?Plan Discussed with: CRNA and Surgeon ? ?Anesthesia Plan Comments: (Discussed risks  of anesthesia with patient, including possibility of difficulty with spontaneous ventilation under anesthesia necessitating airway intervention, PONV, and rare risks such as cardiac or respiratory or neurological events, and allergic reactions. Discussed the role of CRNA in patient's perioperative care. Patient understands.)   ? ? ? ? ? ? ?Anesthesia Quick Evaluation ? ?

## 2021-11-16 NOTE — Transfer of Care (Signed)
Immediate Anesthesia Transfer of Care Note ? ?Patient: Tyler Young ? ?Procedure(s) Performed: COLONOSCOPY WITH PROPOFOL ? ?Patient Location: PACU ? ?Anesthesia Type:General ? ?Level of Consciousness: drowsy ? ?Airway & Oxygen Therapy: Patient Spontanous Breathing ? ?Post-op Assessment: Report given to RN and Post -op Vital signs reviewed and stable ? ?Post vital signs: Reviewed and stable ? ?Last Vitals:  ?Vitals Value Taken Time  ?BP 106/61 11/16/21 1444  ?Temp 35.8 ?C 11/16/21 1444  ?Pulse 52 11/16/21 1446  ?Resp 20 11/16/21 1446  ?SpO2 99 % 11/16/21 1446  ? ? ?Last Pain:  ?Vitals:  ? 11/16/21 1444  ?TempSrc: Temporal  ?PainSc: Asleep  ?   ? ?  ? ?Complications: No notable events documented. ?

## 2021-11-17 NOTE — Anesthesia Postprocedure Evaluation (Signed)
Anesthesia Post Note ? ?Patient: Tyler Young ? ?Procedure(s) Performed: COLONOSCOPY WITH PROPOFOL ? ?Patient location during evaluation: Endoscopy ?Anesthesia Type: General ?Level of consciousness: awake and alert ?Pain management: pain level controlled ?Vital Signs Assessment: post-procedure vital signs reviewed and stable ?Respiratory status: spontaneous breathing, nonlabored ventilation, respiratory function stable and patient connected to nasal cannula oxygen ?Cardiovascular status: blood pressure returned to baseline and stable ?Postop Assessment: no apparent nausea or vomiting ?Anesthetic complications: no ? ? ?No notable events documented. ? ? ?Last Vitals:  ?Vitals:  ? 11/16/21 1454 11/16/21 1504  ?BP: 115/74 (!) 157/78  ?Pulse: (!) 53 (!) 52  ?Resp: 15 10  ?Temp:    ?SpO2: 100% 100%  ?  ?Last Pain:  ?Vitals:  ? 11/16/21 1504  ?TempSrc:   ?PainSc: 0-No pain  ? ? ?  ?  ?  ?  ?  ?  ? ?Precious Haws Lorely Bubb ? ? ? ? ?

## 2021-11-18 ENCOUNTER — Encounter: Payer: Self-pay | Admitting: Internal Medicine

## 2021-11-20 LAB — SURGICAL PATHOLOGY

## 2021-11-21 ENCOUNTER — Encounter (INDEPENDENT_AMBULATORY_CARE_PROVIDER_SITE_OTHER): Payer: Self-pay

## 2021-11-22 ENCOUNTER — Other Ambulatory Visit: Payer: Self-pay

## 2021-11-22 ENCOUNTER — Ambulatory Visit
Admission: RE | Admit: 2021-11-22 | Discharge: 2021-11-22 | Disposition: A | Discharge status: Home / Self Care | Payer: Medicare Other | Attending: Vascular Surgery | Admitting: Vascular Surgery

## 2021-11-22 ENCOUNTER — Encounter
Admission: RE | Disposition: A | Discharge status: Home / Self Care | Payer: Self-pay | Source: Home / Self Care | Attending: Vascular Surgery

## 2021-11-22 ENCOUNTER — Encounter: Payer: Self-pay | Admitting: Vascular Surgery

## 2021-11-22 DIAGNOSIS — I824Y2 Acute embolism and thrombosis of unspecified deep veins of left proximal lower extremity: Secondary | ICD-10-CM | POA: Diagnosis not present

## 2021-11-22 DIAGNOSIS — E785 Hyperlipidemia, unspecified: Secondary | ICD-10-CM | POA: Insufficient documentation

## 2021-11-22 DIAGNOSIS — Z4589 Encounter for adjustment and management of other implanted devices: Secondary | ICD-10-CM | POA: Diagnosis not present

## 2021-11-22 DIAGNOSIS — E039 Hypothyroidism, unspecified: Secondary | ICD-10-CM | POA: Diagnosis not present

## 2021-11-22 DIAGNOSIS — Z87891 Personal history of nicotine dependence: Secondary | ICD-10-CM | POA: Insufficient documentation

## 2021-11-22 DIAGNOSIS — I2699 Other pulmonary embolism without acute cor pulmonale: Secondary | ICD-10-CM | POA: Diagnosis not present

## 2021-11-22 DIAGNOSIS — I82409 Acute embolism and thrombosis of unspecified deep veins of unspecified lower extremity: Secondary | ICD-10-CM | POA: Diagnosis not present

## 2021-11-22 DIAGNOSIS — Z7901 Long term (current) use of anticoagulants: Secondary | ICD-10-CM | POA: Diagnosis not present

## 2021-11-22 DIAGNOSIS — I10 Essential (primary) hypertension: Secondary | ICD-10-CM | POA: Diagnosis not present

## 2021-11-22 DIAGNOSIS — Z79890 Hormone replacement therapy (postmenopausal): Secondary | ICD-10-CM | POA: Insufficient documentation

## 2021-11-22 HISTORY — PX: IVC FILTER REMOVAL: CATH118246

## 2021-11-22 SURGERY — IVC FILTER REMOVAL
Anesthesia: Moderate Sedation

## 2021-11-22 MED ORDER — ONDANSETRON HCL 4 MG/2ML IJ SOLN
4.0000 mg | Freq: Four times a day (QID) | INTRAMUSCULAR | Status: DC | PRN
Start: 2021-11-22 — End: 2021-11-22

## 2021-11-22 MED ORDER — HYDROMORPHONE HCL 1 MG/ML IJ SOLN: 1.0000 mg | Freq: Once | INTRAMUSCULAR | Status: DC | PRN

## 2021-11-22 MED ORDER — DIPHENHYDRAMINE HCL 50 MG/ML IJ SOLN: 50.0000 mg | Freq: Once | INTRAMUSCULAR | Status: DC | PRN

## 2021-11-22 MED ORDER — FENTANYL CITRATE (PF) 100 MCG/2ML IJ SOLN
INTRAMUSCULAR | Status: DC | PRN
  Administered 2021-11-22: 50 ug via INTRAVENOUS
  Administered 2021-11-22: 25 ug via INTRAVENOUS

## 2021-11-22 MED ORDER — FENTANYL CITRATE (PF) 100 MCG/2ML IJ SOLN
INTRAMUSCULAR | Status: AC
  Filled 2021-11-22: qty 2

## 2021-11-22 MED ORDER — FAMOTIDINE 20 MG PO TABS: 40.0000 mg | ORAL_TABLET | Freq: Once | ORAL | Status: DC | PRN

## 2021-11-22 MED ORDER — MIDAZOLAM HCL 2 MG/2ML IJ SOLN
INTRAMUSCULAR | Status: DC | PRN
Start: 2021-11-22 — End: 2021-11-22
  Administered 2021-11-22: 2 mg via INTRAVENOUS
  Administered 2021-11-22 (×2): .5 mg via INTRAVENOUS

## 2021-11-22 MED ORDER — MIDAZOLAM HCL 2 MG/2ML IJ SOLN
INTRAMUSCULAR | Status: AC
  Filled 2021-11-22: qty 4

## 2021-11-22 MED ORDER — SODIUM CHLORIDE 0.9 % IV SOLN: INTRAVENOUS | Status: DC

## 2021-11-22 MED ORDER — IODIXANOL 320 MG/ML IV SOLN
INTRAVENOUS | Status: DC | PRN
Start: 2021-11-22 — End: 2021-11-22
  Administered 2021-11-22: 10 mL

## 2021-11-22 MED ORDER — MIDAZOLAM HCL 2 MG/ML PO SYRP: 8.0000 mg | ORAL_SOLUTION | Freq: Once | ORAL | Status: DC | PRN

## 2021-11-22 MED ORDER — METHYLPREDNISOLONE SODIUM SUCC 125 MG IJ SOLR: 125.0000 mg | Freq: Once | INTRAMUSCULAR | Status: DC | PRN

## 2021-11-22 MED ORDER — VANCOMYCIN HCL IN DEXTROSE 1-5 GM/200ML-% IV SOLN
1000.0000 mg | INTRAVENOUS | Status: AC
  Administered 2021-11-22: 1000 mg via INTRAVENOUS
  Filled 2021-11-22 (×2): qty 200

## 2021-11-22 SURGICAL SUPPLY — 5 items
CANNULA 5F STIFF (CANNULA) ×1 IMPLANT
COVER PROBE U/S 5X48 (MISCELLANEOUS) ×1 IMPLANT
KIT SNARE RETRIEVAL (KITS) ×1 IMPLANT
PACK ANGIOGRAPHY (CUSTOM PROCEDURE TRAY) ×3 IMPLANT
WIRE GUIDERIGHT .035X150 (WIRE) ×1 IMPLANT

## 2021-11-22 NOTE — H&P (View-Only) (Signed)
? ? ? ? ?MRN : [id number] ? ?Tyler Young is a 76 y.o. (1945-07-27) male who presents with chief complaint of legs hurt and swell. ? ?History of Present Illness:  ? ?The patient presents to the office for follow-up evaluation status post left lower extremity thrombectomy.  Thrombectomy was performed at at [facility] on 04/08/2021.   ?  ?Procedure 04/08/2021: ?Placement of a Denali IVC filter infrarenal ? 2.   Mechanical thrombectomy of the left popliteal, SFV and common femoral vein using the penumbra CAT 12 lightening catheter ?  ?The initial symptoms were pain and swelling in the left lower extremity. ?  ?The patient notes the leg is much improved although there is still some pain and swelling with prolonged dependency.  Symptoms are much better with elevation.  The patient notes minimal edema in the morning.   ?  ?The patient has not been using compression therapy at this point. ?  ?No SOB or pleuritic chest pains.  No cough or hemoptysis. ?  ?No blood per rectum or blood in any sputum.  No excessive bruising per the patient.  The patient notes the leg continues to be very painful with dependency and swells quite a bite.  Symptoms are much better with elevation.  The patient notes minimal edema in the morning which steadily worsens throughout the day.   ?  ?The patient has not been using compression therapy at this point. ?  ?No SOB or pleuritic chest pains.  No cough or hemoptysis. ?  ?No blood per rectum or blood in any sputum.  No excessive bruising per the patient. ? ?Current Meds  ?Medication Sig  ? acetaminophen (TYLENOL) 325 MG tablet Take 2 tablets (650 mg total) by mouth every 6 (six) hours as needed for mild pain, fever or headache. (Patient taking differently: Take 325-650 mg by mouth every 6 (six) hours as needed for mild pain, fever or headache.)  ? amLODipine (NORVASC) 2.5 MG tablet Take 2.5 mg by mouth daily.  ? diphenhydrAMINE (SOMINEX) 25 MG tablet Take 25 mg by mouth at bedtime as needed for sleep.   ? doxazosin (CARDURA) 2 MG tablet Take 2 mg by mouth every evening.  ? eplerenone (INSPRA) 50 MG tablet Take 50 mg by mouth 2 (two) times daily.  ? fenofibrate (TRICOR) 48 MG tablet Take 48 mg by mouth daily.  ? gabapentin (NEURONTIN) 100 MG capsule Take 200 mg by mouth at bedtime.  ? levothyroxine (SYNTHROID, LEVOTHROID) 125 MCG tablet Take 125 mcg by mouth daily before breakfast.  ? loratadine (CLARITIN) 10 MG tablet Take 10 mg by mouth daily as needed for allergies.  ? Omega-3 Fatty Acids (FISH OIL PO) Take 1 capsule by mouth daily.  ? omeprazole (PRILOSEC) 20 MG capsule Take 20 mg by mouth daily as needed (acid reflux).  ? pravastatin (PRAVACHOL) 20 MG tablet Take 20 mg by mouth every evening.  ? triamcinolone (NASACORT) 55 MCG/ACT AERO nasal inhaler Place 1-2 sprays into the nose daily as needed (allergies).  ? ? ?Past Medical History:  ?Diagnosis Date  ? AKI (acute kidney injury) ([place])   ? Anginal pain ([place])   ? AT REST. STRESS/ECHO 07/25/18  ? Cancer [facility])   ? SKIN   ? Chronic hypokalemia   ? SEEING DR [facility]  ? Dyspnea   ? DOE  ? GERD (gastroesophageal reflux disease)   ? History of kidney stones   ? HLD (hyperlipidemia)   ? Hypertension   ? Hypothyroidism   ? Neuropathy   ?  FEET  ? Pre-diabetes   ? Rheumatic fever   ? Ventricular dysfunction   ? 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 Clayborn Bigness  ? ? ?Past Surgical History:  ?Procedure Laterality Date  ? COLONOSCOPY    ? COLONOSCOPY WITH PROPOFOL N/A 11/16/2021  ? Procedure: COLONOSCOPY WITH PROPOFOL;  Surgeon: [place], Benay Pike, MD;  Location: [facility] ENDOSCOPY;  Service: Gastroenterology;  Laterality: N/A;  ? EYE SURGERY    ? MYRINGOTOMY WITH TUBE PLACEMENT Bilateral 09/19/2018  ? Procedure: MYRINGOTOMY WITH TUBE PLACEMENT;  Surgeon: Carloyn Manner, MD;  Location: [facility] ORS;  Service: ENT;  Laterality: Bilateral;  ? NASAL POLYP EXCISION    ? PAROTIDECTOMY Right   ? PERIPHERAL VASCULAR THROMBECTOMY Left 04/08/2021  ? Procedure: PERIPHERAL VASCULAR  THROMBECTOMY;  Surgeon: Katha Cabal, MD;  Location: [place] CV LAB;  Service: Cardiovascular;  Laterality: Left;  ? SPHINCTEROTOMY    ? THYROIDECTOMY, PARTIAL    ? TONSILLECTOMY    ? ? ?Social History ?Social History  ? ?Tobacco Use  ? Smoking status: Former  ?  Types: Cigarettes  ?  Quit date: 09/16/1989  ?  Years since quitting: 32.2  ? Smokeless tobacco: Never  ?Vaping Use  ? Vaping Use: Never used  ?Substance Use Topics  ? Alcohol use: Yes  ?  Alcohol/week: 2.0 - 3.0 standard drinks  ?  Types: 2 - 3 Cans of beer per week  ?  Comment: WEEKENDS  ? Drug use: Not Currently  ? ? ?Family History ?History reviewed. No pertinent family history. ? ?Allergies  ?Allergen Reactions  ? Doxycycline Other (See Comments)  ?  General malaise   ? Penicillins Other (See Comments)  ?  Did it involve swelling of the face/tongue/throat, SOB, or low BP? Unknown ?Did it involve sudden or severe rash/hives, skin peeling, or any reaction on the inside of your mouth or nose? Unknown ?Did you need to seek medical attention at a hospital or doctor's office? Yes ?When did it last happen? Childhood reaction at 76 years old       ?If all above answers are ?NO?, may proceed with cephalosporin use. ?  ? ? ? ?REVIEW OF SYSTEMS (Negative unless checked) ? ?Constitutional: '[]'$ Weight loss  '[]'$ Fever  '[]'$ Chills ?Cardiac: '[]'$ Chest pain   '[]'$ Chest pressure   '[]'$ Palpitations   '[]'$ Shortness of breath when laying flat   '[]'$ Shortness of breath with exertion. ?Vascular:  '[]'$ Pain in legs with walking   '[x]'$ Pain in legs at rest  '[]'$ History of DVT   '[]'$ Phlebitis   '[x]'$ Swelling in legs   '[]'$ Varicose veins   '[]'$ Non-healing ulcers ?Pulmonary:   '[]'$ Uses home oxygen   '[]'$ Productive cough   '[]'$ Hemoptysis   '[]'$ Wheeze  '[]'$ COPD   '[]'$ Asthma ?Neurologic:  '[]'$ Dizziness   '[]'$ Seizures   '[]'$ History of stroke   '[]'$ History of TIA  '[]'$ Aphasia   '[]'$ Vissual changes   '[]'$ Weakness or numbness in arm   '[]'$ Weakness or numbness in leg ?Musculoskeletal:   '[]'$ Joint swelling   '[]'$ Joint pain   '[]'$ Low back  pain ?Hematologic:  '[]'$ Easy bruising  '[]'$ Easy bleeding   '[]'$ Hypercoagulable state   '[]'$ Anemic ?Gastrointestinal:  '[]'$ Diarrhea   '[]'$ Vomiting  '[]'$ Gastroesophageal reflux/heartburn   '[]'$ Difficulty swallowing. ?Genitourinary:  '[]'$ Chronic kidney disease   '[]'$ Difficult urination  '[]'$ Frequent urination   '[]'$ Blood in urine ?Skin:  '[]'$ Rashes   '[]'$ Ulcers  ?Psychological:  '[]'$ History of anxiety   '[]'$  History of major depression. ? ?Physical Examination ? ?Vitals:  ? 11/22/21 0924  ?Resp: 10  ?Temp: 97.9 ?F (36.6 ?C)  ?  TempSrc: Oral  ?SpO2: 98%  ? ?There is no height or weight on file to calculate BMI. ?Gen: WD/WN, NAD ?Head: [place]/AT, No temporalis wasting.  ?Ear/Nose/Throat: Hearing grossly intact, nares w/o erythema or drainage, pinna without lesions ?Eyes: PER, EOMI, sclera nonicteric.  ?Neck: Supple, no gross masses.  No JVD.  ?Pulmonary:  Good air movement, no audible wheezing, no use of accessory muscles.  ?Cardiac: RRR, precordium not hyperdynamic. ?Vascular:  scattered varicosities present bilaterally.  Moderate venous stasis changes to the legs bilaterally.  2+ soft pitting edema  ?Vessel Right Left  ?Radial Palpable Palpable  ?Gastrointestinal: soft, non-distended. No guarding/no peritoneal signs.  ?Musculoskeletal: M/S 5/5 throughout.  No deformity.  ?Neurologic: CN 2-12 intact. Pain and light touch intact in extremities.  Symmetrical.  Speech is fluent. Motor exam as listed above. ?Psychiatric: Judgment intact, Mood & affect appropriate for pt's clinical situation. ?Dermatologic: Venous rashes no ulcers noted.  No changes consistent with cellulitis. ?Lymph : No lichenification or skin changes of chronic lymphedema. ? ?CBC ?Lab Results  ?Component Value Date  ? WBC 8.2 04/09/2021  ? HGB 12.6 (L) 04/09/2021  ? HCT 34.4 (L) 04/09/2021  ? MCV 90.5 04/09/2021  ? PLT 147 (L) 04/09/2021  ? ? ?BMET ?   ?Component Value Date/Time  ? NA 135 04/09/2021 0516  ? K 3.9 04/09/2021 0516  ? CL 105 04/09/2021 0516  ? CO2 23 04/09/2021 0516  ? GLUCOSE  125 (H) 04/09/2021 0516  ? BUN 16 04/09/2021 0516  ? CREATININE 0.93 04/09/2021 0516  ? CALCIUM 8.1 (L) 04/09/2021 0516  ? GFRNONAA >60 04/09/2021 0516  ? ?CrCl cannot be calculated (Patient's most recent lab re

## 2021-11-22 NOTE — Progress Notes (Signed)
? ? ? ? ?MRN : [id number] ? ?Tyler Young Patch is a 76 y.o. ([birth year]) male who presents with chief complaint of legs hurt and swell. ? ?History of Present Illness:  ? ?The patient presents to the office for follow-up evaluation status post left lower extremity thrombectomy.  Thrombectomy was performed at at [facility] on 04/08/2021.   ?  ?Procedure 04/08/2021: ?Placement of a Denali IVC filter infrarenal ? 2.   Mechanical thrombectomy of the left popliteal, SFV and common femoral vein using the penumbra CAT 12 lightening catheter ?  ?The initial symptoms were pain and swelling in the left lower extremity. ?  ?The patient notes the leg is much improved although there is still some pain and swelling with prolonged dependency.  Symptoms are much better with elevation.  The patient notes minimal edema in the morning.   ?  ?The patient has not been using compression therapy at this point. ?  ?No SOB or pleuritic chest pains.  No cough or hemoptysis. ?  ?No blood per rectum or blood in any sputum.  No excessive bruising per the patient.  The patient notes the leg continues to be very painful with dependency and swells quite a bite.  Symptoms are much better with elevation.  The patient notes minimal edema in the morning which steadily worsens throughout the day.   ?  ?The patient has not been using compression therapy at this point. ?  ?No SOB or pleuritic chest pains.  No cough or hemoptysis. ?  ?No blood per rectum or blood in any sputum.  No excessive bruising per the patient. ? ?Current Meds  ?Medication Sig  ? acetaminophen (TYLENOL) 325 MG tablet Take 2 tablets (650 mg total) by mouth every 6 (six) hours as needed for mild pain, fever or headache. (Patient taking differently: Take 325-650 mg by mouth every 6 (six) hours as needed for mild pain, fever or headache.)  ? amLODipine (NORVASC) 2.5 MG tablet Take 2.5 mg by mouth daily.  ? diphenhydrAMINE (SOMINEX) 25 MG tablet Take 25 mg by mouth at bedtime as needed for sleep.   ? doxazosin (CARDURA) 2 MG tablet Take 2 mg by mouth every evening.  ? eplerenone (INSPRA) 50 MG tablet Take 50 mg by mouth 2 (two) times daily.  ? fenofibrate (TRICOR) 48 MG tablet Take 48 mg by mouth daily.  ? gabapentin (NEURONTIN) 100 MG capsule Take 200 mg by mouth at bedtime.  ? levothyroxine (SYNTHROID, LEVOTHROID) 125 MCG tablet Take 125 mcg by mouth daily before breakfast.  ? loratadine (CLARITIN) 10 MG tablet Take 10 mg by mouth daily as needed for allergies.  ? Omega-3 Fatty Acids (FISH OIL PO) Take 1 capsule by mouth daily.  ? omeprazole (PRILOSEC) 20 MG capsule Take 20 mg by mouth daily as needed (acid reflux).  ? pravastatin (PRAVACHOL) 20 MG tablet Take 20 mg by mouth every evening.  ? triamcinolone (NASACORT) 55 MCG/ACT AERO nasal inhaler Place 1-2 sprays into the nose daily as needed (allergies).  ? ? ?Past Medical History:  ?Diagnosis Date  ? AKI (acute kidney injury) ([place])   ? Anginal pain ([place])   ? AT REST. STRESS/ECHO 07/25/18  ? Cancer [facility])   ? SKIN   ? Chronic hypokalemia   ? SEEING [facility]  ? Dyspnea   ? DOE  ? GERD (gastroesophageal reflux disease)   ? History of kidney stones   ? HLD (hyperlipidemia)   ? Hypertension   ? Hypothyroidism   ? Neuropathy   ?  FEET  ? Pre-diabetes   ? Rheumatic fever   ? Ventricular dysfunction   ? 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 Clayborn Bigness  ? ? ?Past Surgical History:  ?Procedure Laterality Date  ? COLONOSCOPY    ? COLONOSCOPY WITH PROPOFOL N/A 11/16/2021  ? Procedure: COLONOSCOPY WITH PROPOFOL;  Surgeon: Toledo, Benay Pike, MD;  Location: [facility] ENDOSCOPY;  Service: Gastroenterology;  Laterality: N/A;  ? EYE SURGERY    ? MYRINGOTOMY WITH TUBE PLACEMENT Bilateral 09/19/2018  ? Procedure: MYRINGOTOMY WITH TUBE PLACEMENT;  Surgeon: Carloyn Manner, MD;  Location: [facility] ORS;  Service: ENT;  Laterality: Bilateral;  ? NASAL POLYP EXCISION    ? PAROTIDECTOMY Right   ? PERIPHERAL VASCULAR THROMBECTOMY Left 04/08/2021  ? Procedure: PERIPHERAL VASCULAR  THROMBECTOMY;  Surgeon: Katha Cabal, MD;  Location: [facility];  Service: Cardiovascular;  Laterality: Left;  ? SPHINCTEROTOMY    ? THYROIDECTOMY, PARTIAL    ? TONSILLECTOMY    ? ? ?Social History ?Social History  ? ?Tobacco Use  ? Smoking status: Former  ?  Types: Cigarettes  ?  Quit date: 09/16/1989  ?  Years since quitting: 32.2  ? Smokeless tobacco: Never  ?Vaping Use  ? Vaping Use: Never used  ?Substance Use Topics  ? Alcohol use: Yes  ?  Alcohol/week: 2.0 - 3.0 standard drinks  ?  Types: 2 - 3 Cans of beer per week  ?  Comment: WEEKENDS  ? Drug use: Not Currently  ? ? ?Family History ?History reviewed. No pertinent family history. ? ?Allergies  ?Allergen Reactions  ? Doxycycline Other (See Comments)  ?  General malaise   ? Penicillins Other (See Comments)  ?  Did it involve swelling of the face/tongue/throat, SOB, or low BP? Unknown ?Did it involve sudden or severe rash/hives, skin peeling, or any reaction on the inside of your mouth or nose? Unknown ?Did you need to seek medical attention at a hospital or doctor's office? Yes ?When did it last happen? Childhood reaction at 76 years old       ?If all above answers are ?NO?, may proceed with cephalosporin use. ?  ? ? ? ?REVIEW OF SYSTEMS (Negative unless checked) ? ?Constitutional: '[]'$ Weight loss  '[]'$ Fever  '[]'$ Chills ?Cardiac: '[]'$ Chest pain   '[]'$ Chest pressure   '[]'$ Palpitations   '[]'$ Shortness of breath when laying flat   '[]'$ Shortness of breath with exertion. ?Vascular:  '[]'$ Pain in legs with walking   '[x]'$ Pain in legs at rest  '[]'$ History of DVT   '[]'$ Phlebitis   '[x]'$ Swelling in legs   '[]'$ Varicose veins   '[]'$ Non-healing ulcers ?Pulmonary:   '[]'$ Uses home oxygen   '[]'$ Productive cough   '[]'$ Hemoptysis   '[]'$ Wheeze  '[]'$ COPD   '[]'$ Asthma ?Neurologic:  '[]'$ Dizziness   '[]'$ Seizures   '[]'$ History of stroke   '[]'$ History of TIA  '[]'$ Aphasia   '[]'$ Vissual changes   '[]'$ Weakness or numbness in arm   '[]'$ Weakness or numbness in leg ?Musculoskeletal:   '[]'$ Joint swelling   '[]'$ Joint pain   '[]'$ Low back  pain ?Hematologic:  '[]'$ Easy bruising  '[]'$ Easy bleeding   '[]'$ Hypercoagulable state   '[]'$ Anemic ?Gastrointestinal:  '[]'$ Diarrhea   '[]'$ Vomiting  '[]'$ Gastroesophageal reflux/heartburn   '[]'$ Difficulty swallowing. ?Genitourinary:  '[]'$ Chronic kidney disease   '[]'$ Difficult urination  '[]'$ Frequent urination   '[]'$ Blood in urine ?Skin:  '[]'$ Rashes   '[]'$ Ulcers  ?Psychological:  '[]'$ History of anxiety   '[]'$  History of major depression. ? ?Physical Examination ? ?Vitals:  ? 11/22/21 0924  ?Resp: 10  ?Temp: 97.9 ?F (36.6 ?C)  ?  TempSrc: Oral  ?SpO2: 98%  ? ?There is no height or weight on file to calculate BMI. ?Gen: WD/WN, NAD ?Head: [place]/AT, No temporalis wasting.  ?Ear/Nose/Throat: Hearing grossly intact, nares w/o erythema or drainage, pinna without lesions ?Eyes: PER, EOMI, sclera nonicteric.  ?Neck: Supple, no gross masses.  No JVD.  ?Pulmonary:  Good air movement, no audible wheezing, no use of accessory muscles.  ?Cardiac: RRR, precordium not hyperdynamic. ?Vascular:  scattered varicosities present bilaterally.  Moderate venous stasis changes to the legs bilaterally.  2+ soft pitting edema  ?Vessel Right Left  ?Radial Palpable Palpable  ?Gastrointestinal: soft, non-distended. No guarding/no peritoneal signs.  ?Musculoskeletal: M/S 5/5 throughout.  No deformity.  ?Neurologic: CN 2-12 intact. Pain and light touch intact in extremities.  Symmetrical.  Speech is fluent. Motor exam as listed above. ?Psychiatric: Judgment intact, Mood & affect appropriate for pt's clinical situation. ?Dermatologic: Venous rashes no ulcers noted.  No changes consistent with cellulitis. ?Lymph : No lichenification or skin changes of chronic lymphedema. ? ?CBC ?Lab Results  ?Component Value Date  ? WBC 8.2 04/09/2021  ? HGB 12.6 (L) 04/09/2021  ? HCT 34.4 (L) 04/09/2021  ? MCV 90.5 04/09/2021  ? PLT 147 (L) 04/09/2021  ? ? ?BMET ?   ?Component Value Date/Time  ? NA 135 04/09/2021 0516  ? K 3.9 04/09/2021 0516  ? CL 105 04/09/2021 0516  ? CO2 23 04/09/2021 0516  ? GLUCOSE  125 (H) 04/09/2021 0516  ? BUN 16 04/09/2021 0516  ? CREATININE 0.93 04/09/2021 0516  ? CALCIUM 8.1 (L) 04/09/2021 0516  ? GFRNONAA >60 04/09/2021 0516  ? ?CrCl cannot be calculated (Patient's most recent lab re

## 2021-11-22 NOTE — Interval H&P Note (Signed)
History and Physical Interval Note: ? ?11/22/2021 ?9:42 AM ? ?Tyler Young  has presented today for surgery, with the diagnosis of IVC Filter Removal   DVT.  The various methods of treatment have been discussed with the patient and family. After consideration of risks, benefits and other options for treatment, the patient has consented to  Procedure(s): ?IVC FILTER REMOVAL (N/A) as a surgical intervention.  The patient's history has been reviewed, patient examined, no change in status, stable for surgery.  I have reviewed the patient's chart and labs.  Questions were answered to the patient's satisfaction.   ? ? ?Hortencia Pilar ? ? ?

## 2021-11-22 NOTE — Op Note (Signed)
?  OPERATIVE NOTE ? ? ?PRE-OPERATIVE DIAGNOSIS: DVT with bilateral PE ? ?POST-OPERATIVE DIAGNOSIS: Same ? ?PROCEDURE: ?Retrieval of IVC Filter ?Inferior Vena Cavagram ? ?SURGEON: Katha Cabal, M.D. ? ?ANESTHESIA:  Conscious sedation was administered under my direct supervision by the interventional radiology RN. IV Versed plus fentanyl were utilized. Continuous ECG, pulse oximetry and blood pressure was monitored throughout the entire procedure. Conscious sedation was for a total of 23 minutes 35 seconds. ? ?ESTIMATED BLOOD LOSS: Minimal cc ? ?FINDING(S):inferior vena cava is widely patent filter is in place in good position. Filter is removed without incident ? ?SPECIMEN(S):  IVC filter intact ? ?INDICATIONS:   ?Tyler Young is a 76 y.o. male who presents with DVT and PE. The patient has now tolerated anticoagulation for several months. Therefore, the IVC filter is recommended to be removed. The risks and benefits were reviewed with the patient all questions were answered and they agreed to proceed with IVC filter retrieval. Oral anticoagulation will be continued. ? ?DESCRIPTION: After obtaining full informed written consent, the patient was brought back to the Special Procedure Suite and placed in the supine position.  The patient received IV antibiotics prior to induction.  After obtaining adequate sedation, the patient was prepped and draped in the standard fashion and appropriate time out is called.   ? ? Ultrasound was placed in a sterile sleeve.The right neck was then imaged with ultrasound.   Jugular vein was identified it is echolucent and homogeneous indicating patency. 1% lidocaine is then infiltrated under ultrasound visualization and subsequently a Seldinger needle is inserted under real-time ultrasound guidance. ? ?J-wire is then advanced into the inferior vena cava under fluoroscopic guidance. With the tip of the sheath at the confluence of the iliac veins inferior vena caval imaging is  performed. ? ?After review of the image the sheath is repositioned to above the filter and the snares introduced. Snares opened and the hook is secured without difficulty. The filter is then collapsed within the sheath and removed without difficulty. ? ?Sheath is removed by pressures held the patient tolerated the procedure well and there were no immediate complications. ? ?Interpretation: inferior vena cava is widely patent filter is in place in good position.  No thrombus is identified within the filter.  Filter is removed without incident.  ? ? ? ?COMPLICATIONS: None ? ?CONDITION: Good ? ?Katha Cabal, M.D. ?[facility] ?Office: [phone] ? ? ?11/22/2021, 10:32 AM ?  ?

## 2021-11-30 ENCOUNTER — Other Ambulatory Visit (INDEPENDENT_AMBULATORY_CARE_PROVIDER_SITE_OTHER): Payer: Self-pay | Admitting: Vascular Surgery

## 2021-11-30 DIAGNOSIS — I82412 Acute embolism and thrombosis of left femoral vein: Secondary | ICD-10-CM

## 2021-11-30 NOTE — Progress Notes (Signed)
MRN : [id number]  Tyler Young is a 76 y.o. (1945-11-21) male who presents with chief complaint of legs hurt and swell.  History of Present Illness:   The patient presents to the office for follow up s/p IVC filter removal on 11/22/2021.  The patient presents to the office for follow-up evaluation status post left lower extremity thrombectomy.  Thrombectomy was performed at at [facility] on 04/08/2021.     Procedure 04/08/2021: Placement of a Denali IVC filter infrarenal  2.   Mechanical thrombectomy of the left popliteal, SFV and common femoral vein using the penumbra CAT 12 lightening catheter   The initial symptoms were pain and swelling in the left lower extremity.   The patient notes the leg is much improved although there is still some pain and swelling with prolonged dependency.  Symptoms are much better with elevation.  The patient notes minimal edema in the morning.     The patient has not been using compression therapy at this point.   No SOB or pleuritic chest pains.  No cough or hemoptysis.   No recent shortening of the patient's walking distance or new symptoms consistent with claudication.  No history of rest pain symptoms. No new ulcers or wounds of the lower extremities have occurred.   Venous duplex today shows improved flow with recanalization of the left SFV  No outpatient medications have been marked as taking for the 12/01/21 encounter (Appointment) with Delana Meyer, Dolores Lory, MD.    Past Medical History:  Diagnosis Date   AKI (acute kidney injury) ([place])    Anginal pain ([place])    AT REST. 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  Clayborn Bigness    Past Surgical History:  Procedure Laterality Date   COLONOSCOPY     COLONOSCOPY WITH PROPOFOL N/A 11/16/2021   Procedure: COLONOSCOPY WITH PROPOFOL;  Surgeon: Toledo, Benay Pike, MD;  Location: [facility] ENDOSCOPY;  Service: Gastroenterology;  Laterality: N/A;   EYE SURGERY     IVC FILTER REMOVAL N/A 11/22/2021   Procedure: IVC FILTER REMOVAL;  Surgeon: Katha Cabal, MD;  Location: [place] [facility];  Service: Cardiovascular;  Laterality: N/A;   MYRINGOTOMY WITH TUBE PLACEMENT Bilateral 09/19/2018   Procedure: MYRINGOTOMY WITH TUBE PLACEMENT;  Surgeon: Carloyn Manner, MD;  Location: [facility] ORS;  Service: ENT;  Laterality: Bilateral;   NASAL POLYP EXCISION     PAROTIDECTOMY Right    PERIPHERAL VASCULAR THROMBECTOMY Left 04/08/2021   Procedure: PERIPHERAL VASCULAR THROMBECTOMY;  Surgeon: Katha Cabal, MD;  Location: [place] [facility];  Service: Cardiovascular;  Laterality: Left;   SPHINCTEROTOMY     THYROIDECTOMY, PARTIAL     TONSILLECTOMY      Social History Social History   Tobacco Use   Smoking status: Former    Types: Cigarettes    Quit date: 09/16/1989    Years since quitting: 32.2   Smokeless tobacco: Never  Vaping Use   Vaping Use: Never used  Substance Use Topics   Alcohol use: Yes  Alcohol/week: 2.0 - 3.0 standard drinks    Types: 2 - 3 Cans of beer per week    Comment: WEEKENDS   Drug use: Not Currently    Family History No family history on file.  Allergies  Allergen Reactions   Doxycycline Other (See Comments)    General malaise    Penicillins Other (See Comments)    Did it involve swelling of the face/tongue/throat, SOB, or low BP? Unknown Did it involve sudden or severe rash/hives, skin peeling, or any reaction on the inside of your mouth or nose? Unknown Did you need to seek medical attention at a hospital or doctor's office? Yes When did it last happen? Childhood reaction at 76 years old       If all above answers are "NO", may  proceed with cephalosporin use.      REVIEW OF SYSTEMS (Negative unless checked)  Constitutional: '[]'$ Weight loss  '[]'$ Fever  '[]'$ Chills Cardiac: '[]'$ Chest pain   '[]'$ Chest pressure   '[]'$ Palpitations   '[]'$ Shortness of breath when laying flat   '[]'$ Shortness of breath with exertion. Vascular:  '[]'$ Pain in legs with walking   '[x]'$ Pain in legs at rest  '[]'$ History of DVT   '[]'$ Phlebitis   '[x]'$ Swelling in legs   '[]'$ Varicose veins   '[]'$ Non-healing ulcers Pulmonary:   '[]'$ Uses home oxygen   '[]'$ Productive cough   '[]'$ Hemoptysis   '[]'$ Wheeze  '[]'$ COPD   '[]'$ Asthma Neurologic:  '[]'$ Dizziness   '[]'$ Seizures   '[]'$ History of stroke   '[]'$ History of TIA  '[]'$ Aphasia   '[]'$ Vissual changes   '[]'$ Weakness or numbness in arm   '[]'$ Weakness or numbness in leg Musculoskeletal:   '[]'$ Joint swelling   '[]'$ Joint pain   '[]'$ Low back pain Hematologic:  '[]'$ Easy bruising  '[]'$ Easy bleeding   '[]'$ Hypercoagulable state   '[]'$ Anemic Gastrointestinal:  '[]'$ Diarrhea   '[]'$ Vomiting  '[]'$ Gastroesophageal reflux/heartburn   '[]'$ Difficulty swallowing. Genitourinary:  '[]'$ Chronic kidney disease   '[]'$ Difficult urination  '[]'$ Frequent urination   '[]'$ Blood in urine Skin:  '[]'$ Rashes   '[]'$ Ulcers  Psychological:  '[]'$ History of anxiety   '[]'$  History of major depression.  Physical Examination  There were no vitals filed for this visit. There is no height or weight on file to calculate BMI. Gen: WD/WN, NAD Head: /AT, No temporalis wasting.  Ear/Nose/Throat: Hearing grossly intact, nares w/o erythema or drainage, pinna without lesions Eyes: PER, EOMI, sclera nonicteric.  Neck: Supple, no gross masses.  No JVD.  Pulmonary:  Good air movement, no audible wheezing, no use of accessory muscles.  Cardiac: RRR, precordium not hyperdynamic. Vascular:  scattered varicosities present bilaterally.  Moderate venous stasis changes to the legs bilaterally.  2+ soft pitting edema left > right Vessel Right Left  Radial Palpable Palpable  Gastrointestinal: soft, non-distended. No guarding/no peritoneal signs.   Musculoskeletal: M/S 5/5 throughout.  No deformity.  Neurologic: CN 2-12 intact. Pain and light touch intact in extremities.  Symmetrical.  Speech is fluent. Motor exam as listed above. Psychiatric: Judgment intact, Mood & affect appropriate for pt's clinical situation. Dermatologic: Venous rashes no ulcers noted.  No changes consistent with cellulitis. Lymph : No lichenification or skin changes of chronic lymphedema.  CBC Lab Results  Component Value Date   WBC 8.2 04/09/2021   HGB 12.6 (L) 04/09/2021   HCT 34.4 (L) 04/09/2021   MCV 90.5 04/09/2021   PLT 147 (L) 04/09/2021    BMET    Component Value Date/Time   NA 135 04/09/2021 0516   K 3.9 04/09/2021 0516   CL 105 04/09/2021 0516  CO2 23 04/09/2021 0516   GLUCOSE 125 (H) 04/09/2021 0516   BUN 16 04/09/2021 0516   CREATININE 0.93 04/09/2021 0516   CALCIUM 8.1 (L) 04/09/2021 0516   GFRNONAA >60 04/09/2021 0516   CrCl cannot be calculated (Patient's most recent lab result is older than the maximum 21 days allowed.).  COAG Lab Results  Component Value Date   INR 1.1 04/07/2021    Radiology PERIPHERAL VASCULAR CATHETERIZATION  Result Date: 11/22/2021 See surgical note for result.    Assessment/Plan 1. Multiple subsegmental pulmonary emboli without acute cor pulmonale (HCC) Recommend:   No surgery or intervention at this point in time.  IVC filter has been removed.  Patient's duplex ultrasound of the venous system shows recanalization of previous DVT of the femoral veins.  The patient is on anticoagulation   Elevation was stressed, such as the use of a recliner.  I have discussed  DVT and post phlebitic changes such as swelling and why it  causes symptoms such as pain.  The patient should wear graduated compression stockings beginning after three full days of anticoagulation.  The compression should be worn on a daily basis. The patient should wearing the stockings first thing in the morning and removing them  in the evening. The patient should not to sleep in the stockings.  In addition, behavioral modification including elevation during the day and avoidance of prolonged dependency will be initiated.    The patient will continue anticoagulation for now as there have not been any problems or complications at this point.    - Hypercoagulable panel, comprehensive; Future  2. Essential hypertension Continue antihypertensive medications as already ordered, these medications have been reviewed and there are no changes at this time.   3. Hyperlipidemia, unspecified hyperlipidemia type Continue statin as ordered and reviewed, no changes at this time     Hortencia Pilar, MD  11/30/2021 8:31 PM

## 2021-12-01 ENCOUNTER — Ambulatory Visit (INDEPENDENT_AMBULATORY_CARE_PROVIDER_SITE_OTHER): Payer: Medicare Other

## 2021-12-01 ENCOUNTER — Encounter (INDEPENDENT_AMBULATORY_CARE_PROVIDER_SITE_OTHER): Payer: Self-pay | Admitting: Vascular Surgery

## 2021-12-01 ENCOUNTER — Ambulatory Visit (INDEPENDENT_AMBULATORY_CARE_PROVIDER_SITE_OTHER): Payer: Medicare Other | Admitting: Vascular Surgery

## 2021-12-01 VITALS — BP 105/65 | HR 66 | Resp 16 | Ht 72.0 in | Wt 196.0 lb

## 2021-12-01 DIAGNOSIS — I10 Essential (primary) hypertension: Secondary | ICD-10-CM

## 2021-12-01 DIAGNOSIS — I2694 Multiple subsegmental pulmonary emboli without acute cor pulmonale: Secondary | ICD-10-CM

## 2021-12-01 DIAGNOSIS — I82412 Acute embolism and thrombosis of left femoral vein: Secondary | ICD-10-CM

## 2021-12-01 DIAGNOSIS — E785 Hyperlipidemia, unspecified: Secondary | ICD-10-CM | POA: Diagnosis not present

## 2021-12-03 ENCOUNTER — Encounter (INDEPENDENT_AMBULATORY_CARE_PROVIDER_SITE_OTHER): Payer: Self-pay | Admitting: Vascular Surgery

## 2021-12-15 ENCOUNTER — Ambulatory Visit (INDEPENDENT_AMBULATORY_CARE_PROVIDER_SITE_OTHER): Payer: Medicare Other | Admitting: Vascular Surgery

## 2022-02-27 ENCOUNTER — Other Ambulatory Visit: Payer: Self-pay | Admitting: Internal Medicine

## 2022-02-27 DIAGNOSIS — R519 Headache, unspecified: Secondary | ICD-10-CM

## 2022-03-06 ENCOUNTER — Ambulatory Visit
Admission: RE | Admit: 2022-03-06 | Discharge: 2022-03-06 | Disposition: A | Discharge status: Home / Self Care | Payer: Medicare Other | Source: Ambulatory Visit | Attending: Internal Medicine | Admitting: Internal Medicine

## 2022-03-06 DIAGNOSIS — R519 Headache, unspecified: Secondary | ICD-10-CM | POA: Diagnosis present

## 2022-03-15 ENCOUNTER — Other Ambulatory Visit
Admission: RE | Admit: 2022-03-15 | Discharge: 2022-03-15 | Disposition: A | Discharge status: Home / Self Care | Payer: Medicare Other | Source: Ambulatory Visit | Attending: Vascular Surgery | Admitting: Vascular Surgery

## 2022-03-15 DIAGNOSIS — Z7901 Long term (current) use of anticoagulants: Secondary | ICD-10-CM | POA: Insufficient documentation

## 2022-03-15 DIAGNOSIS — Z86718 Personal history of other venous thrombosis and embolism: Secondary | ICD-10-CM | POA: Insufficient documentation

## 2022-03-15 DIAGNOSIS — D689 Coagulation defect, unspecified: Secondary | ICD-10-CM | POA: Insufficient documentation

## 2022-03-15 DIAGNOSIS — D696 Thrombocytopenia, unspecified: Secondary | ICD-10-CM | POA: Insufficient documentation

## 2022-03-15 DIAGNOSIS — I6381 Other cerebral infarction due to occlusion or stenosis of small artery: Secondary | ICD-10-CM | POA: Insufficient documentation

## 2022-03-15 DIAGNOSIS — I2694 Multiple subsegmental pulmonary emboli without acute cor pulmonale: Secondary | ICD-10-CM | POA: Insufficient documentation

## 2022-03-15 DIAGNOSIS — D6859 Other primary thrombophilia: Secondary | ICD-10-CM | POA: Diagnosis present

## 2022-03-15 NOTE — Addendum Note (Signed)
Addended by: Antonieta Iba C on: 03/15/2022 01:51 PM   Modules accepted: Orders

## 2022-03-23 LAB — MISC LABCORP TEST (SEND OUT)
LabCorp test name: 501790
Labcorp test code: 501790

## 2022-04-05 NOTE — Progress Notes (Unsigned)
MRN : [id number]  Tyler Young is a 76 y.o. (January 23, 1946) male who presents with chief complaint of legs hurt and swell.  History of Present Illness:   The patient presents to the office for follow up s/p IVC filter removal on 11/22/2021.   He is status post left lower extremity thrombectomy performed at at [facility] on 04/08/2021.     Procedure 04/08/2021: Placement of a Denali IVC filter infrarenal  2.   Mechanical thrombectomy of the left popliteal, SFV and common femoral vein using the penumbra CAT 12 lightening catheter   The initial symptoms were pain and swelling in the left lower extremity.   The patient notes the leg is much improved although there is still some pain and swelling with prolonged dependency.  Symptoms are much better with elevation.  The patient notes minimal edema in the morning.     The patient has not been using compression therapy at this point.   No SOB or pleuritic chest pains.  No cough or hemoptysis.    No recent shortening of the patient's walking distance or new symptoms consistent with claudication.  No history of rest pain symptoms. No new ulcers or wounds of the lower extremities have occurred.    Venous duplex today shows improved flow with recanalization of the left SFV  No outpatient medications have been marked as taking for the 04/06/22 encounter (Appointment) with Delana Meyer, Dolores Lory, MD.    Past Medical History:  Diagnosis Date   AKI (acute kidney injury) ([place])    Anginal pain ([place])    AT REST. 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 Clayborn Bigness    Past Surgical History:  Procedure Laterality Date    COLONOSCOPY     COLONOSCOPY WITH PROPOFOL N/A 11/16/2021   Procedure: COLONOSCOPY WITH PROPOFOL;  Surgeon: Toledo, Benay Pike, MD;  Location: [facility] ENDOSCOPY;  Service: Gastroenterology;  Laterality: N/A;   EYE SURGERY     IVC FILTER REMOVAL N/A 11/22/2021   Procedure: IVC FILTER REMOVAL;  Surgeon: Katha Cabal, MD;  Location: [place] [facility];  Service: Cardiovascular;  Laterality: N/A;   MYRINGOTOMY WITH TUBE PLACEMENT Bilateral 09/19/2018   Procedure: MYRINGOTOMY WITH TUBE PLACEMENT;  Surgeon: Carloyn Manner, MD;  Location: [facility] ORS;  Service: ENT;  Laterality: Bilateral;   NASAL POLYP EXCISION     PAROTIDECTOMY Right    PERIPHERAL VASCULAR THROMBECTOMY Left 04/08/2021   Procedure: PERIPHERAL VASCULAR THROMBECTOMY;  Surgeon: Katha Cabal, MD;  Location: [place] [facility];  Service: Cardiovascular;  Laterality: Left;   SPHINCTEROTOMY     THYROIDECTOMY, PARTIAL     TONSILLECTOMY      Social History Social History   Tobacco Use   Smoking status: Former    Types: Cigarettes    Quit date: 09/16/1989    Years since quitting: 32.5   Smokeless tobacco: Never  Vaping Use   Vaping Use: Never used  Substance Use Topics   Alcohol use: Yes  Alcohol/week: 2.0 - 3.0 standard drinks of alcohol    Types: 2 - 3 Cans of beer per week    Comment: WEEKENDS   Drug use: Not Currently    Family History No family history on file.  Allergies  Allergen Reactions   Doxycycline Other (See Comments)    General malaise    Penicillins Other (See Comments)    Did it involve swelling of the face/tongue/throat, SOB, or low BP? Unknown Did it involve sudden or severe rash/hives, skin peeling, or any reaction on the inside of your mouth or nose? Unknown Did you need to seek medical attention at a hospital or doctor's office? Yes When did it last happen? Childhood reaction at 76 years old       If all above answers are "NO", may proceed with cephalosporin use.      REVIEW OF  SYSTEMS (Negative unless checked)  Constitutional: '[]'$ Weight loss  '[]'$ Fever  '[]'$ Chills Cardiac: '[]'$ Chest pain   '[]'$ Chest pressure   '[]'$ Palpitations   '[]'$ Shortness of breath when laying flat   '[]'$ Shortness of breath with exertion. Vascular:  '[]'$ Pain in legs with walking   '[x]'$ Pain in legs at rest  '[]'$ History of DVT   '[]'$ Phlebitis   '[x]'$ Swelling in legs   '[]'$ Varicose veins   '[]'$ Non-healing ulcers Pulmonary:   '[]'$ Uses home oxygen   '[]'$ Productive cough   '[]'$ Hemoptysis   '[]'$ Wheeze  '[]'$ COPD   '[]'$ Asthma Neurologic:  '[]'$ Dizziness   '[]'$ Seizures   '[]'$ History of stroke   '[]'$ History of TIA  '[]'$ Aphasia   '[]'$ Vissual changes   '[]'$ Weakness or numbness in arm   '[]'$ Weakness or numbness in leg Musculoskeletal:   '[]'$ Joint swelling   '[]'$ Joint pain   '[]'$ Low back pain Hematologic:  '[]'$ Easy bruising  '[]'$ Easy bleeding   '[]'$ Hypercoagulable state   '[]'$ Anemic Gastrointestinal:  '[]'$ Diarrhea   '[]'$ Vomiting  '[]'$ Gastroesophageal reflux/heartburn   '[]'$ Difficulty swallowing. Genitourinary:  '[]'$ Chronic kidney disease   '[]'$ Difficult urination  '[]'$ Frequent urination   '[]'$ Blood in urine Skin:  '[]'$ Rashes   '[]'$ Ulcers  Psychological:  '[]'$ History of anxiety   '[]'$  History of major depression.  Physical Examination  There were no vitals filed for this visit. There is no height or weight on file to calculate BMI. Gen: WD/WN, NAD Head: [place]/AT, No temporalis wasting.  Ear/Nose/Throat: Hearing grossly intact, nares w/o erythema or drainage, pinna without lesions Eyes: PER, EOMI, sclera nonicteric.  Neck: Supple, no gross masses.  No JVD.  Pulmonary:  Good air movement, no audible wheezing, no use of accessory muscles.  Cardiac: RRR, precordium not hyperdynamic. Vascular:  scattered varicosities present bilaterally.  Moderate venous stasis changes to the legs bilaterally.  2+ soft pitting edema  Vessel Right Left  Radial Palpable Palpable  Gastrointestinal: soft, non-distended. No guarding/no peritoneal signs.  Musculoskeletal: M/S 5/5 throughout.  No deformity.  Neurologic: CN 2-12  intact. Pain and light touch intact in extremities.  Symmetrical.  Speech is fluent. Motor exam as listed above. Psychiatric: Judgment intact, Mood & affect appropriate for pt's clinical situation. Dermatologic: Venous rashes no ulcers noted.  No changes consistent with cellulitis. Lymph : No lichenification or skin changes of chronic lymphedema.  CBC Lab Results  Component Value Date   WBC 8.2 04/09/2021   HGB 12.6 (L) 04/09/2021   HCT 34.4 (L) 04/09/2021   MCV 90.5 04/09/2021   PLT 147 (L) 04/09/2021    BMET    Component Value Date/Time   NA 135 04/09/2021 0516   K 3.9 04/09/2021 0516   CL 105 04/09/2021 0516  CO2 23 04/09/2021 0516   GLUCOSE 125 (H) 04/09/2021 0516   BUN 16 04/09/2021 0516   CREATININE 0.93 04/09/2021 0516   CALCIUM 8.1 (L) 04/09/2021 0516   GFRNONAA >60 04/09/2021 0516   CrCl cannot be calculated (Patient's most recent lab result is older than the maximum 21 days allowed.).  COAG Lab Results  Component Value Date   INR 1.1 04/07/2021    Radiology MR BRAIN WO CONTRAST  Result Date: 03/06/2022 CLINICAL DATA:  Provided history: Left-sided headache. EXAM: MRI HEAD WITHOUT CONTRAST TECHNIQUE: Multiplanar, multiecho pulse sequences of the brain and surrounding structures were obtained without intravenous contrast. COMPARISON:  None Available. FINDINGS: Brain: Mild cerebral atrophy without definite lobar predominance. Multifocal T2 FLAIR hyperintense signal abnormality within the cerebral white matter, nonspecific but compatible with mild chronic small vessel ischemic disease. Small foci of T2 hyperintensity within the right basal ganglia, which may reflect prominent perivascular spaces and/or small chronic lacunar infarcts. There is no acute infarct. No evidence of an intracranial mass. No extra-axial fluid collection. No midline shift. Vascular: Maintained flow voids within the proximal large arterial vessels. Skull and upper cervical spine: No focal suspicious  marrow lesion. Sinuses/Orbits: No mass or acute finding within the imaged orbits. Trace mucosal thickening within the right frontal, bilateral ethmoid, bilateral sphenoid and bilateral maxillary sinuses. Other: Bilateral mastoid effusions. IMPRESSION: 1. No evidence of acute intracranial abnormality. 2. Mild chronic small vessel ischemic changes within the cerebral white matter. 3. Prominent perivascular spaces and/or small chronic lacunar infarcts within the right basal ganglia. 4. Mild cerebral atrophy. 5. Bilateral mastoid effusions. Electronically Signed   By: Kellie Simmering D.O.   On: 03/06/2022 17:59     Assessment/Plan There are no diagnoses linked to this encounter.   Hortencia Pilar, MD  04/05/2022 12:06 PM

## 2022-04-06 ENCOUNTER — Ambulatory Visit (INDEPENDENT_AMBULATORY_CARE_PROVIDER_SITE_OTHER): Payer: Medicare Other | Admitting: Vascular Surgery

## 2022-04-06 ENCOUNTER — Encounter (INDEPENDENT_AMBULATORY_CARE_PROVIDER_SITE_OTHER): Payer: Self-pay | Admitting: Vascular Surgery

## 2022-04-06 VITALS — BP 125/71 | HR 69 | Resp 16 | Wt 196.0 lb

## 2022-04-06 DIAGNOSIS — E785 Hyperlipidemia, unspecified: Secondary | ICD-10-CM | POA: Diagnosis not present

## 2022-04-06 DIAGNOSIS — D6859 Other primary thrombophilia: Secondary | ICD-10-CM | POA: Insufficient documentation

## 2022-04-06 DIAGNOSIS — I10 Essential (primary) hypertension: Secondary | ICD-10-CM

## 2022-04-06 DIAGNOSIS — I824Y2 Acute embolism and thrombosis of unspecified deep veins of left proximal lower extremity: Secondary | ICD-10-CM

## 2022-04-06 MED ORDER — APIXABAN 5 MG PO TABS: 5.0000 mg | ORAL_TABLET | Freq: Two times a day (BID) | ORAL | 3 refills | Status: DC

## 2022-04-13 ENCOUNTER — Ambulatory Visit (INDEPENDENT_AMBULATORY_CARE_PROVIDER_SITE_OTHER): Payer: Medicare Other | Admitting: Vascular Surgery

## 2022-05-08 ENCOUNTER — Encounter (INDEPENDENT_AMBULATORY_CARE_PROVIDER_SITE_OTHER): Payer: Self-pay

## 2023-01-03 ENCOUNTER — Other Ambulatory Visit: Payer: Self-pay | Admitting: Internal Medicine

## 2023-01-03 DIAGNOSIS — R0602 Shortness of breath: Secondary | ICD-10-CM

## 2023-01-03 DIAGNOSIS — R0609 Other forms of dyspnea: Secondary | ICD-10-CM

## 2023-01-03 DIAGNOSIS — I2089 Other forms of angina pectoris: Secondary | ICD-10-CM

## 2023-01-22 ENCOUNTER — Other Ambulatory Visit (HOSPITAL_COMMUNITY): Payer: Self-pay | Admitting: Emergency Medicine

## 2023-01-22 ENCOUNTER — Telehealth (HOSPITAL_COMMUNITY): Payer: Self-pay | Admitting: Emergency Medicine

## 2023-01-22 ENCOUNTER — Encounter (HOSPITAL_COMMUNITY): Payer: Self-pay

## 2023-01-22 DIAGNOSIS — R079 Chest pain, unspecified: Secondary | ICD-10-CM

## 2023-01-22 MED ORDER — METOPROLOL TARTRATE 100 MG PO TABS: 100.0000 mg | ORAL_TABLET | Freq: Once | ORAL | 0 refills | Status: DC

## 2023-01-22 NOTE — Telephone Encounter (Signed)
Attempted to call patient regarding upcoming cardiac CT appointment. °Left message on voicemail with name and callback number °Sara Wallace RN Navigator Cardiac Imaging °[facility] °[phone] Office °[phone] Cell ° °

## 2023-01-24 ENCOUNTER — Ambulatory Visit
Admission: RE | Admit: 2023-01-24 | Discharge: 2023-01-24 | Disposition: A | Discharge status: Home / Self Care | Payer: Medicare Other | Source: Ambulatory Visit | Attending: Internal Medicine | Admitting: Internal Medicine

## 2023-01-24 DIAGNOSIS — R0602 Shortness of breath: Secondary | ICD-10-CM | POA: Insufficient documentation

## 2023-01-24 DIAGNOSIS — R0609 Other forms of dyspnea: Secondary | ICD-10-CM | POA: Diagnosis present

## 2023-01-24 DIAGNOSIS — I2089 Other forms of angina pectoris: Secondary | ICD-10-CM | POA: Diagnosis present

## 2023-01-24 MED ORDER — NITROGLYCERIN 0.4 MG SL SUBL
0.4000 mg | SUBLINGUAL_TABLET | Freq: Once | SUBLINGUAL | Status: AC
  Administered 2023-01-24: 0.4 mg via SUBLINGUAL

## 2023-01-24 MED ORDER — IOHEXOL 350 MG/ML SOLN
80.0000 mL | Freq: Once | INTRAVENOUS | Status: AC | PRN
  Administered 2023-01-24: 80 mL via INTRAVENOUS

## 2023-01-24 NOTE — Progress Notes (Signed)

## 2023-03-22 ENCOUNTER — Other Ambulatory Visit (INDEPENDENT_AMBULATORY_CARE_PROVIDER_SITE_OTHER): Payer: Self-pay | Admitting: Vascular Surgery

## 2023-03-22 NOTE — Telephone Encounter (Signed)
Patient has an appt next month. Will provide another refill if there are no changes to his meds,

## 2023-04-02 ENCOUNTER — Ambulatory Visit (INDEPENDENT_AMBULATORY_CARE_PROVIDER_SITE_OTHER): Payer: Medicare Other | Admitting: Vascular Surgery

## 2023-04-23 ENCOUNTER — Ambulatory Visit (INDEPENDENT_AMBULATORY_CARE_PROVIDER_SITE_OTHER): Payer: Medicare Other | Admitting: Vascular Surgery

## 2023-04-23 ENCOUNTER — Encounter (INDEPENDENT_AMBULATORY_CARE_PROVIDER_SITE_OTHER): Payer: Self-pay | Admitting: Vascular Surgery

## 2023-04-23 VITALS — BP 126/78 | HR 75 | Resp 18 | Ht 72.0 in | Wt 203.8 lb

## 2023-04-23 DIAGNOSIS — E785 Hyperlipidemia, unspecified: Secondary | ICD-10-CM | POA: Diagnosis not present

## 2023-04-23 DIAGNOSIS — I825Y2 Chronic embolism and thrombosis of unspecified deep veins of left proximal lower extremity: Secondary | ICD-10-CM

## 2023-04-23 DIAGNOSIS — I10 Essential (primary) hypertension: Secondary | ICD-10-CM | POA: Diagnosis not present

## 2023-04-23 NOTE — Progress Notes (Unsigned)
MRN : [id number]  Tyler Young is a 77 y.o. (06/18/1946) male who presents with chief complaint of legs hurt and swell.  History of Present Illness:   The patient presents to the office for follow up of DVT.  Procedure 11/22/2021:  IVC filter removal.   Procedure 04/08/2021: Placement of a Denali IVC filter infrarenal  2.   Mechanical thrombectomy of the left popliteal, SFV and common femoral vein using the penumbra CAT 12 lightening catheter   The initial symptoms were pain and swelling in the left lower extremity.   The patient notes the leg is much improved although there is still some pain and swelling with prolonged dependency.  Symptoms are much better with elevation.  The patient notes minimal edema in the morning.     The patient has not been using compression therapy at this point.   No SOB or pleuritic chest pains.  No cough or hemoptysis.    No recent shortening of the patient's walking distance or new symptoms consistent with claudication.  No history of rest pain symptoms. No new ulcers or wounds of the lower extremities have occurred.    Previous venous duplex shows improved flow with recanalization of the left SFV.   Hypercoagulable state is reviewed with the patient and he is + for heterozygous Factor V leiden  Current Meds  Medication Sig   acetaminophen (TYLENOL) 325 MG tablet Take 2 tablets (650 mg total) by mouth every 6 (six) hours as needed for mild pain, fever or headache. (Patient taking differently: Take 325-650 mg by mouth every 6 (six) hours as needed for mild pain, fever or headache.)   Alpha-Lipoic Acid 200 MG CAPS Take by mouth.   apixaban (ELIQUIS) 5 MG TABS tablet TAKE ONE TABLET BY MOUTH TWICE DAILY   diphenhydrAMINE (SOMINEX) 25 MG tablet Take 25 mg by mouth at bedtime as needed for sleep.   doxazosin (CARDURA) 2 MG tablet Take 2 mg by mouth every evening.   eplerenone (INSPRA) 50 MG tablet Take 50 mg by mouth 2 (two) times daily.    fenofibrate (TRICOR) 48 MG tablet Take 48 mg by mouth daily.   gabapentin (NEURONTIN) 100 MG capsule Take 200 mg by mouth at bedtime.   levothyroxine (SYNTHROID, LEVOTHROID) 125 MCG tablet Take 125 mcg by mouth daily before breakfast.   loratadine (CLARITIN) 10 MG tablet Take 10 mg by mouth daily as needed for allergies.   Magnesium Citrate 200 MG TABS Take 400 mg by mouth daily.   Omega-3 Fatty Acids (FISH OIL PO) Take 1 capsule by mouth daily.   omeprazole (PRILOSEC) 20 MG capsule Take 20 mg by mouth daily as needed (acid reflux).   pravastatin (PRAVACHOL) 20 MG tablet Take 20 mg by mouth every evening.   triamcinolone (NASACORT) 55 MCG/ACT AERO nasal inhaler Place 1-2 sprays into the nose daily as needed (allergies).    Past Medical History:  Diagnosis Date   AKI (acute kidney injury) ([facility])    Anginal pain ([facility])    AT REST. 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 Juliann Pares    Past Surgical History:  Procedure Laterality Date   COLONOSCOPY     COLONOSCOPY WITH PROPOFOL N/A 11/16/2021   Procedure: COLONOSCOPY WITH PROPOFOL;  Surgeon: Toledo, Boykin Nearing, MD;  Location: [facility] ENDOSCOPY;  Service: Gastroenterology;  Laterality: N/A;   EYE SURGERY     IVC FILTER REMOVAL N/A 11/22/2021   Procedure: IVC FILTER REMOVAL;  Surgeon: Renford Dills, MD;  Location: [facility] INVASIVE CV LAB;  Service: Cardiovascular;  Laterality: N/A;   MYRINGOTOMY WITH TUBE PLACEMENT Bilateral 09/19/2018   Procedure: MYRINGOTOMY WITH TUBE PLACEMENT;  Surgeon: Bud Face, MD;  Location: [facility] ORS;  Service: ENT;  Laterality: Bilateral;   NASAL POLYP EXCISION     PAROTIDECTOMY Right     PERIPHERAL VASCULAR THROMBECTOMY Left 04/08/2021   Procedure: PERIPHERAL VASCULAR THROMBECTOMY;  Surgeon: Renford Dills, MD;  Location: [facility] INVASIVE CV LAB;  Service: Cardiovascular;  Laterality: Left;   SPHINCTEROTOMY     THYROIDECTOMY, PARTIAL     TONSILLECTOMY      Social History Social History   Tobacco Use   Smoking status: Former    Current packs/day: 0.00    Types: Cigarettes    Quit date: 09/16/1989    Years since quitting: 33.6   Smokeless tobacco: Never  Vaping Use   Vaping status: Never Used  Substance Use Topics   Alcohol use: Yes    Alcohol/week: 2.0 - 3.0 standard drinks of alcohol    Types: 2 - 3 Cans of beer per week    Comment: WEEKENDS   Drug use: Not Currently    Family History History reviewed. No pertinent family history.  Allergies  Allergen Reactions   Doxycycline Other (See Comments)    General malaise    Penicillins Other (See Comments)    Did it involve swelling of the face/tongue/throat, SOB, or low BP? Unknown Did it involve sudden or severe rash/hives, skin peeling, or any reaction on the inside of your mouth or nose? Unknown Did you need to seek medical attention at a hospital or doctor's office? Yes When did it last happen? Childhood reaction at 77 years old       If all above answers are "NO", may proceed with cephalosporin use.      REVIEW OF SYSTEMS (Negative unless checked)  Constitutional: [] Weight loss  [] Fever  [] Chills Cardiac: [] Chest pain   [] Chest pressure   [] Palpitations   [] Shortness of breath when laying flat   [] Shortness of breath with exertion. Vascular:  [] Pain in legs with walking   [x] Pain in legs at rest  [] History of DVT   [] Phlebitis   [x] Swelling in legs   [] Varicose veins   [] Non-healing ulcers Pulmonary:   [] Uses home oxygen   [] Productive cough   [] Hemoptysis   [] Wheeze  [] COPD   [] Asthma Neurologic:  [] Dizziness   [] Seizures   [] History of stroke   [] History of TIA  [] Aphasia   [] Vissual changes    [] Weakness or numbness in arm   [] Weakness or numbness in leg Musculoskeletal:   [] Joint swelling   [] Joint pain   [] Low back pain Hematologic:  [] Easy bruising  [] Easy bleeding   [] Hypercoagulable state   [] Anemic Gastrointestinal:  [] Diarrhea   [] Vomiting  [] Gastroesophageal reflux/heartburn   [] Difficulty swallowing. Genitourinary:  [] Chronic kidney disease   [] Difficult urination  [] Frequent urination   [] Blood in urine Skin:  [] Rashes   [] Ulcers  Psychological:  [] History of anxiety   []   History of major depression.  Physical Examination  Vitals:   04/23/23 1109  BP: 126/78  Pulse: 75  Resp: 18  Weight: 203 lb 12.8 oz (92.4 kg)  Height: 6' (1.829 m)   Body mass index is 27.64 kg/m. Gen: WD/WN, NAD Head: [place]/AT, No temporalis wasting.  Ear/Nose/Throat: Hearing grossly intact, nares w/o erythema or drainage, pinna without lesions Eyes: PER, EOMI, sclera nonicteric.  Neck: Supple, no gross masses.  No JVD.  Pulmonary:  Good air movement, no audible wheezing, no use of accessory muscles.  Cardiac: RRR, precordium not hyperdynamic. Vascular:  scattered varicosities present bilaterally.  Moderate venous stasis changes to the legs bilaterally.  2+ soft pitting edema. CEAP [id number]   Vessel Right Left  Radial Palpable Palpable  Gastrointestinal: soft, non-distended. No guarding/no peritoneal signs.  Musculoskeletal: M/S 5/5 throughout.  No deformity.  Neurologic: CN 2-12 intact. Pain and light touch intact in extremities.  Symmetrical.  Speech is fluent. Motor exam as listed above. Psychiatric: Judgment intact, Mood & affect appropriate for pt's clinical situation. Dermatologic: Venous rashes no ulcers noted.  No changes consistent with cellulitis. Lymph : No lichenification or skin changes of chronic lymphedema.  CBC Lab Results  Component Value Date   WBC 8.2 04/09/2021   HGB 12.6 (L) 04/09/2021   HCT 34.4 (L) 04/09/2021   MCV 90.5 04/09/2021   PLT 147 (L) 04/09/2021     BMET    Component Value Date/Time   NA 135 04/09/2021 0516   K 3.9 04/09/2021 0516   CL 105 04/09/2021 0516   CO2 23 04/09/2021 0516   GLUCOSE 125 (H) 04/09/2021 0516   BUN 16 04/09/2021 0516   CREATININE 0.93 04/09/2021 0516   CALCIUM 8.1 (L) 04/09/2021 0516   GFRNONAA >60 04/09/2021 0516   CrCl cannot be calculated (Patient's most recent lab result is older than the maximum 21 days allowed.).  COAG Lab Results  Component Value Date   INR 1.1 04/07/2021    Radiology No results found.   Assessment/Plan There are no diagnoses linked to this encounter.   Levora Dredge, MD  04/23/2023 11:19 AM

## 2023-04-24 ENCOUNTER — Encounter (INDEPENDENT_AMBULATORY_CARE_PROVIDER_SITE_OTHER): Payer: Self-pay | Admitting: Vascular Surgery

## 2023-06-15 ENCOUNTER — Other Ambulatory Visit: Payer: Self-pay | Admitting: Otolaryngology

## 2023-06-18 ENCOUNTER — Other Ambulatory Visit: Payer: Self-pay

## 2023-06-18 ENCOUNTER — Encounter: Payer: Self-pay | Admitting: Otolaryngology

## 2023-06-18 MED ORDER — CIPROFLOXACIN-DEXAMETHASONE 0.3-0.1 % OT SUSP
4.0000 [drp] | Freq: Two times a day (BID) | OTIC | 0 refills | Status: DC
  Filled 2023-06-18: qty 7.5, 7d supply, fill #0
  Filled 2023-06-19: qty 7.5, 14d supply, fill #0

## 2023-06-18 NOTE — Anesthesia Preprocedure Evaluation (Signed)
Anesthesia Evaluation  Patient identified by MRN, date of birth, ID band Patient awake    Reviewed: Allergy & Precautions, H&P , NPO status , Patient's Chart, lab work & pertinent test results  Airway Mallampati: III  TM Distance: >3 FB Neck ROM: Full    Dental no notable dental hx.    Pulmonary shortness of breath, former smoker   Pulmonary exam normal breath sounds clear to auscultation       Cardiovascular hypertension, + angina  Normal cardiovascular exam Rhythm:Regular Rate:Normal  04-08-21  1. Left ventricular ejection fraction, by estimation, is 60 to 65%. The  left ventricle has normal function. Left ventricular endocardial border  not optimally defined to evaluate regional wall motion. There is mild left  ventricular hypertrophy. Left  ventricular diastolic parameters are consistent with Grade I diastolic  dysfunction (impaired relaxation).   2. Right ventricular systolic function is normal. The right ventricular  size is normal. Tricuspid regurgitation signal is inadequate for assessing  PA pressure.   3. Left atrial size was mildly dilated.   4. Right atrial size was mildly dilated.   5. The mitral valve was not well visualized. No evidence of mitral valve  regurgitation. No evidence of mitral stenosis.   6. The aortic valve is tricuspid. There is mild calcification of the  aortic valve. There is mild thickening of the aortic valve. Aortic valve  regurgitation is not visualized. No aortic stenosis is present.     Neuro/Psych negative neurological ROS  negative psych ROS   GI/Hepatic negative GI ROS, Neg liver ROS,GERD  ,,GERD occasionally   Endo/Other  negative endocrine ROSHypothyroidism    Renal/GU Renal diseasenegative Renal ROS  negative genitourinary   Musculoskeletal negative musculoskeletal ROS (+)    Abdominal   Peds negative pediatric ROS (+)  Hematology negative hematology ROS (+)    Anesthesia Other Findings Grade I diastolic dysfunction  Anginal pain (HCC) Hypertension GERD (gastroesophageal reflux disease) Hypothyroidism Dyspnea  Chronic hypokalemia Neuropathy  Pre-diabetes Cancer (HCC) History of kidney stones AKI (acute kidney injury) (HCC) Rheumatic fever HLD (hyperlipidemia)  Ventricular dysfunction DVT (deep venous thrombosis) (HCC)      Reproductive/Obstetrics negative OB ROS                             Anesthesia Physical Anesthesia Plan  ASA: 3  Anesthesia Plan: General   Post-op Pain Management:    Induction: Intravenous  PONV Risk Score and Plan:   Airway Management Planned: Natural Airway and Nasal Cannula  Additional Equipment:   Intra-op Plan:   Post-operative Plan: Extubation in OR  Informed Consent: I have reviewed the patients History and Physical, chart, labs and discussed the procedure including the risks, benefits and alternatives for the proposed anesthesia with the patient or authorized representative who has indicated his/her understanding and acceptance.     Dental Advisory Given  Plan Discussed with: Anesthesiologist, CRNA and Surgeon  Anesthesia Plan Comments: (Patient consented for risks of anesthesia including but not limited to:  - adverse reactions to medications - risk of airway placement if required - damage to eyes, teeth, lips or other oral mucosa - nerve damage due to positioning  - sore throat or hoarseness - Damage to heart, brain, nerves, lungs, other parts of body or loss of life  Patient voiced understanding and assent.)       Anesthesia Quick Evaluation

## 2023-06-18 NOTE — Discharge Instructions (Signed)
[facility] DISCHARGE INSTRUCTIONS FOR MYRINGOTOMY AND TUBE INSERTION  [facility] CREIGHTON VAUGHT, M.D.   Diet:   After surgery, the patient should take only liquids and foods as tolerated.  The patient may then have a regular diet after the effects of anesthesia have worn off, usually about four to six hours after surgery.  Activities:   The patient should rest until the effects of anesthesia have worn off.  After this, there are no restrictions on the normal daily activities.  Medications:   You will be given a prescription for antibiotic drops to be used in the ears postoperatively.  It is recommended to use 4 drops 2 times a day for 4 days, then the drops should be saved for possible future use.  The tubes should not cause any discomfort to the patient, but if there is any question, Tylenol should be given according to the instructions for the age of the patient.  Other medications should be continued normally.  Precautions:   Should there be recurrent drainage after the tubes are placed, the drops should be used for approximately 3-4 days.  If it does not clear, you should call the ENT office.  Earplugs:   Earplugs are only needed for those who are going to be submerged under water.  When taking a bath or shower and using a cup or showerhead to rinse hair, it is not necessary to wear earplugs.  These come in a variety of fashions, all of which can be obtained at our office.  However, if one is not able to come by the office, then silicone plugs can be found at most pharmacies.  It is not advised to stick anything in the ear that is not approved as an earplug.  Silly putty is not to be used as an earplug.  Swimming is allowed in patients after ear tubes are inserted, however, they must wear earplugs if they are going to be submerged under water.  For those children who are going to be swimming a lot, it is recommended to use a fitted ear mold, which can be  made by our audiologist.  If discharge is noticed from the ears, this most likely represents an ear infection.  We would recommend getting your eardrops and using them as indicated above.  If it does not clear, then you should call the ENT office.  For follow up, the patient should return to the ENT office three weeks postoperatively and then every six months as required by the doctor. 

## 2023-06-19 ENCOUNTER — Other Ambulatory Visit: Payer: Self-pay

## 2023-06-20 ENCOUNTER — Encounter
Admission: RE | Disposition: A | Discharge status: Home / Self Care | Payer: Self-pay | Source: Home / Self Care | Attending: Otolaryngology

## 2023-06-20 ENCOUNTER — Encounter: Payer: Self-pay | Admitting: Otolaryngology

## 2023-06-20 ENCOUNTER — Ambulatory Visit
Admission: RE | Admit: 2023-06-20 | Discharge: 2023-06-20 | Disposition: A | Discharge status: Home / Self Care | Payer: Medicare Other | Attending: Otolaryngology | Admitting: Otolaryngology

## 2023-06-20 ENCOUNTER — Other Ambulatory Visit: Payer: Self-pay

## 2023-06-20 ENCOUNTER — Ambulatory Visit: Payer: Self-pay | Admitting: Anesthesiology

## 2023-06-20 DIAGNOSIS — T85628A Displacement of other specified internal prosthetic devices, implants and grafts, initial encounter: Secondary | ICD-10-CM | POA: Diagnosis not present

## 2023-06-20 DIAGNOSIS — H7201 Central perforation of tympanic membrane, right ear: Secondary | ICD-10-CM | POA: Diagnosis not present

## 2023-06-20 DIAGNOSIS — H6983 Other specified disorders of Eustachian tube, bilateral: Secondary | ICD-10-CM | POA: Diagnosis present

## 2023-06-20 DIAGNOSIS — Y831 Surgical operation with implant of artificial internal device as the cause of abnormal reaction of the patient, or of later complication, without mention of misadventure at the time of the procedure: Secondary | ICD-10-CM | POA: Insufficient documentation

## 2023-06-20 DIAGNOSIS — E039 Hypothyroidism, unspecified: Secondary | ICD-10-CM | POA: Insufficient documentation

## 2023-06-20 DIAGNOSIS — I10 Essential (primary) hypertension: Secondary | ICD-10-CM | POA: Insufficient documentation

## 2023-06-20 DIAGNOSIS — K219 Gastro-esophageal reflux disease without esophagitis: Secondary | ICD-10-CM | POA: Insufficient documentation

## 2023-06-20 DIAGNOSIS — Z87891 Personal history of nicotine dependence: Secondary | ICD-10-CM | POA: Diagnosis not present

## 2023-06-20 HISTORY — PX: MYRINGOTOMY WITH TUBE PLACEMENT: SHX5663

## 2023-06-20 HISTORY — DX: Other ill-defined heart diseases: I51.89

## 2023-06-20 HISTORY — PX: FOREIGN BODY REMOVAL EAR: SHX5321

## 2023-06-20 HISTORY — DX: Paresthesia of skin: R20.2

## 2023-06-20 SURGERY — MYRINGOTOMY WITH TUBE PLACEMENT
Anesthesia: General | Laterality: Right

## 2023-06-20 MED ORDER — PROPOFOL 10 MG/ML IV BOLUS
INTRAVENOUS | Status: AC
  Filled 2023-06-20: qty 20

## 2023-06-20 MED ORDER — SODIUM CHLORIDE 0.9% FLUSH
INTRAVENOUS | Status: DC | PRN
  Administered 2023-06-20: 10 mL via INTRAVENOUS

## 2023-06-20 MED ORDER — LIDOCAINE HCL (CARDIAC) PF 100 MG/5ML IV SOSY
PREFILLED_SYRINGE | INTRAVENOUS | Status: DC | PRN
  Administered 2023-06-20: 80 mg via INTRAVENOUS

## 2023-06-20 MED ORDER — PROPOFOL 10 MG/ML IV BOLUS
INTRAVENOUS | Status: DC | PRN
  Administered 2023-06-20: 100 mg via INTRAVENOUS
  Administered 2023-06-20: 30 mg via INTRAVENOUS

## 2023-06-20 SURGICAL SUPPLY — 4 items
COTTONBALL LRG STERILE PKG (GAUZE/BANDAGES/DRESSINGS) ×1 IMPLANT
GLOVE SURG GAMMEX PI TX LF 7.5 (GLOVE) ×1 IMPLANT
TOWEL OR 17X26 4PK STRL BLUE (TOWEL DISPOSABLE) ×1 IMPLANT
TUBE EAR T 1.27X5.3 BFLY (OTOLOGIC RELATED) IMPLANT

## 2023-06-20 NOTE — H&P (Signed)
..  History and Physical paper copy reviewed and updated date of procedure and will be scanned into system.  Patient seen and examined and marked.  

## 2023-06-20 NOTE — Transfer of Care (Signed)
Immediate Anesthesia Transfer of Care Note  Patient: Tyler Young  Procedure(s) Performed: MYRINGOTOMY WITH BUTTERFLY TUBE PLACEMENT (Right) REMOVAL FOREIGN BODY EAR (Right)  Patient Location: PACU  Anesthesia Type: General  Level of Consciousness: awake, alert  and patient cooperative  Airway and Oxygen Therapy: Patient Spontanous Breathing and Patient connected to supplemental oxygen  Post-op Assessment: Post-op Vital signs reviewed, Patient's Cardiovascular Status Stable, Respiratory Function Stable, Patent Airway and No signs of Nausea or vomiting  Post-op Vital Signs: Reviewed and stable  Complications: No notable events documented.

## 2023-06-20 NOTE — Anesthesia Postprocedure Evaluation (Signed)
Anesthesia Post Note  Patient: Tyler Young  Procedure(s) Performed: MYRINGOTOMY WITH BUTTERFLY TUBE PLACEMENT (Right) REMOVAL FOREIGN BODY EAR (Right)  Patient location during evaluation: PACU Anesthesia Type: General Level of consciousness: awake and alert Pain management: pain level controlled Vital Signs Assessment: post-procedure vital signs reviewed and stable Respiratory status: spontaneous breathing, nonlabored ventilation, respiratory function stable and patient connected to nasal cannula oxygen Cardiovascular status: blood pressure returned to baseline and stable Postop Assessment: no apparent nausea or vomiting Anesthetic complications: no   No notable events documented.   Last Vitals:  Vitals:   06/20/23 0846 06/20/23 0852  BP: 124/74 (!) 143/76  Pulse: (!) 55 (!) 57  Resp: 18 14  Temp: (!) 36.4 C   SpO2: 96% 95%    Last Pain:  Vitals:   06/20/23 0846  TempSrc:   PainSc: 0-No pain                 Priyah Schmuck C Hoyte Ziebell

## 2023-06-20 NOTE — Op Note (Signed)
..  06/20/2023  8:41 AM    Tenoch, Lowary  [id number]   Pre-Op Dx:  Junita Push tube dysfuncation, foreign body right middle ear space  Post-op Dx: Eustachian tube dysfuncation, foreign body right middle ear space  Proc:   1)  Removal of foreign body from right middle ear  2)  Right BUTTERFLY tube placement  Surg: Roney Mans Cyndia Degraff  Anes:  General by mask  EBL:  None  Comp:  None  Findings:  Butterfly tube that had been pushed into middle ear space.  This was successfully removed through persistent perforation.  A new butterfly tube was then placed through the existing perforation site in the anterior inferior aspect of the tympanic membrane.  Procedure: With the patient in a comfortable supine position, general mask anesthesia was administered.  At an appropriate level, microscope and speculum were used to examine and clean the RIGHT ear canal.  An anterior inferior perforation was noted with a BUTTERFLY PE tube with in the middle ear space on right patient's right side.  Using an alligator forcep, the tube was grasped through the perforation site and gently removed.  The tube was noted to be plugged.  At this time, a new BUTTERFLY PE tube was placed through the perforation and was noted to be in good stable position.  Following this  The patient was returned to anesthesia, awakened, and transferred to recovery in stable condition.  Dispo:  PACU to home  Plan: Routine drop use and water precautions.  Recheck my office 6 weeks   Bud Face 8:41 AM 06/20/2023

## 2023-06-21 ENCOUNTER — Encounter: Payer: Self-pay | Admitting: Otolaryngology

## 2023-07-16 ENCOUNTER — Other Ambulatory Visit: Payer: Self-pay | Admitting: Internal Medicine

## 2023-07-16 DIAGNOSIS — N289 Disorder of kidney and ureter, unspecified: Secondary | ICD-10-CM

## 2023-07-16 DIAGNOSIS — Z86718 Personal history of other venous thrombosis and embolism: Secondary | ICD-10-CM

## 2023-07-16 DIAGNOSIS — N200 Calculus of kidney: Secondary | ICD-10-CM

## 2023-07-18 ENCOUNTER — Ambulatory Visit
Admission: RE | Admit: 2023-07-18 | Discharge: 2023-07-18 | Disposition: A | Discharge status: Home / Self Care | Payer: Medicare Other | Source: Ambulatory Visit | Attending: Internal Medicine | Admitting: Internal Medicine

## 2023-07-18 DIAGNOSIS — N200 Calculus of kidney: Secondary | ICD-10-CM | POA: Diagnosis present

## 2023-07-18 DIAGNOSIS — Z86718 Personal history of other venous thrombosis and embolism: Secondary | ICD-10-CM | POA: Diagnosis present

## 2023-07-18 DIAGNOSIS — N289 Disorder of kidney and ureter, unspecified: Secondary | ICD-10-CM | POA: Insufficient documentation

## 2023-09-03 IMAGING — US US EXTREM LOW VENOUS*L*
1 series · 13 of 24 positions shown · non-contrast
Comparison: None.

CLINICAL DATA: Leg swelling



[Series 1: us venous img lower uni left (dvt) · portal-venous · 13 of 33 slices shown]
[im 1/33]
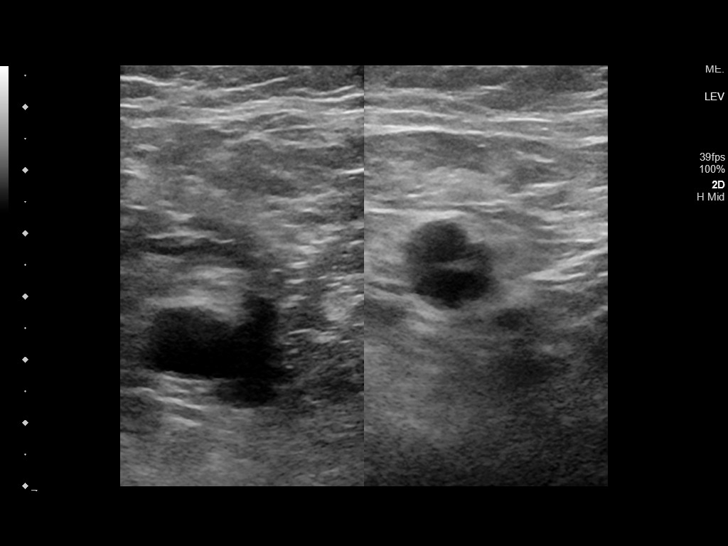
[im 3/33]
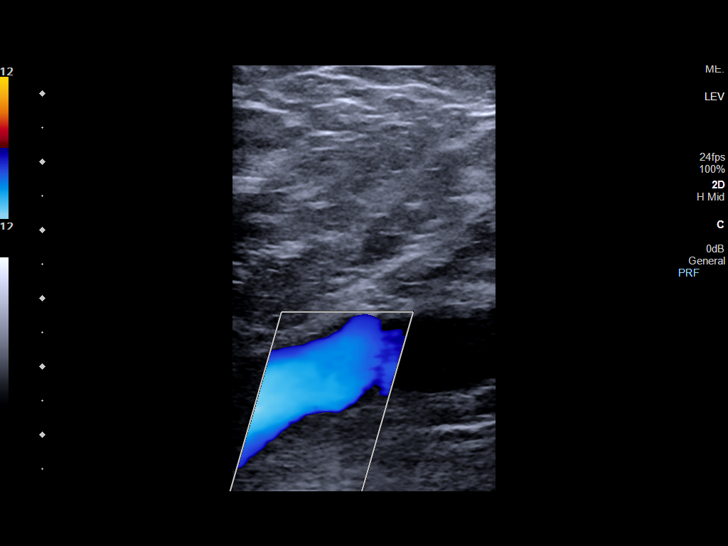
[im 6/33]
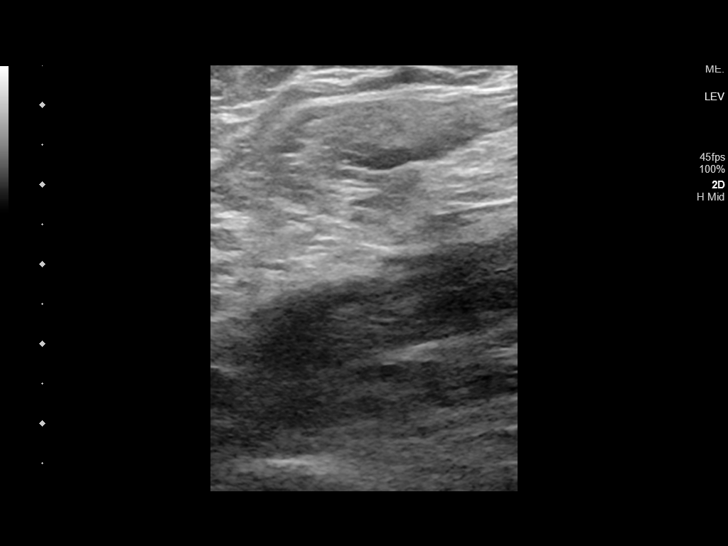
[im 9/33]
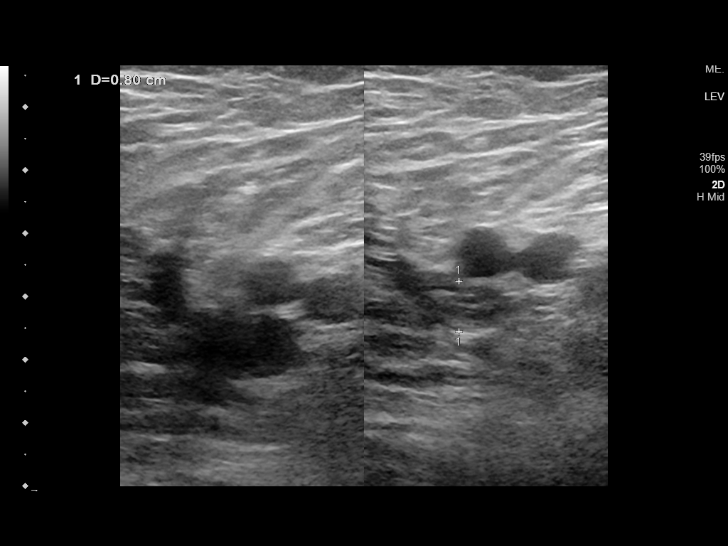
[im 12/33]
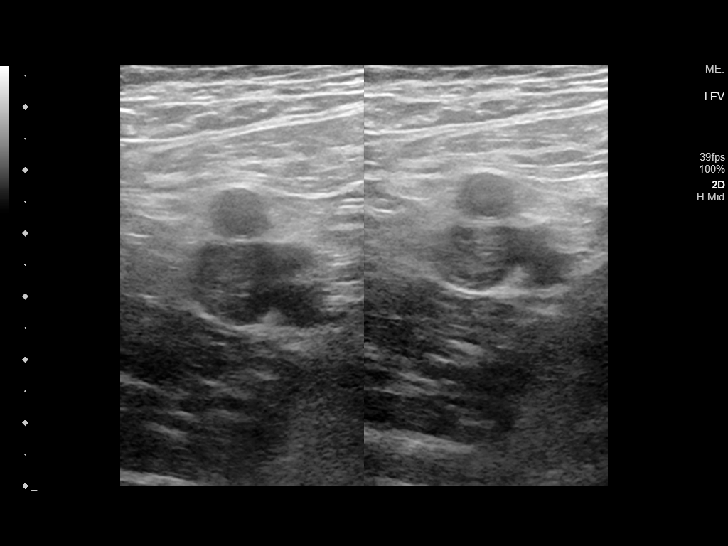
[im 14/33]
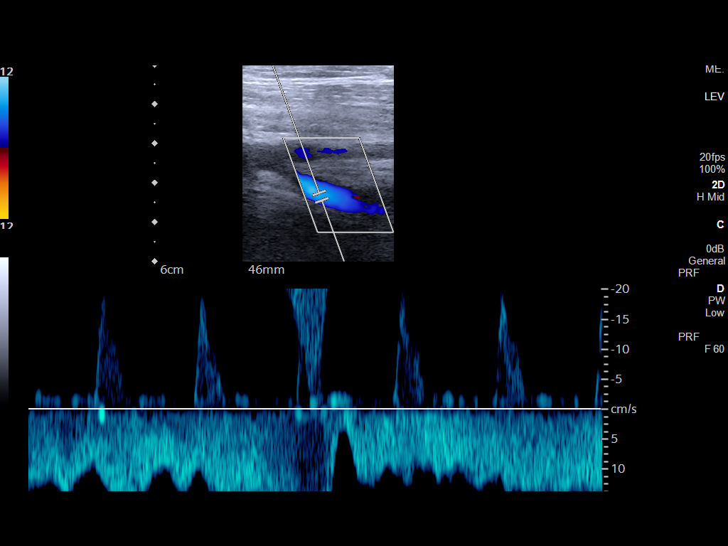
[im 17/33]
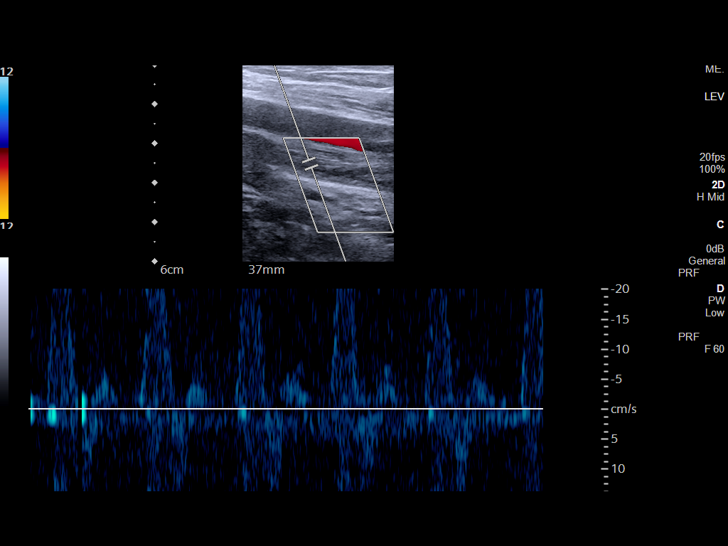
[im 19/33]
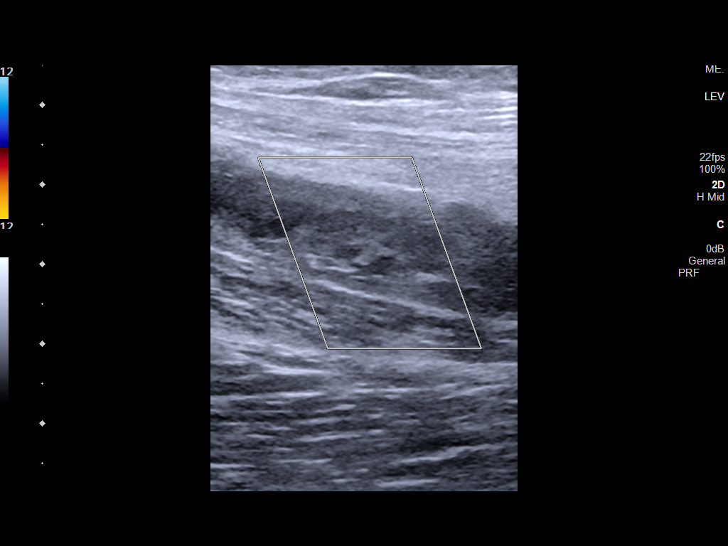
[im 21/33]
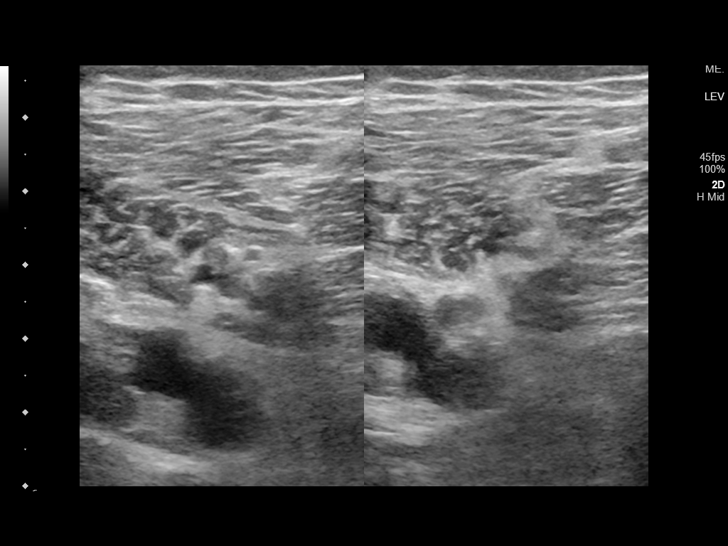
[im 24/33]
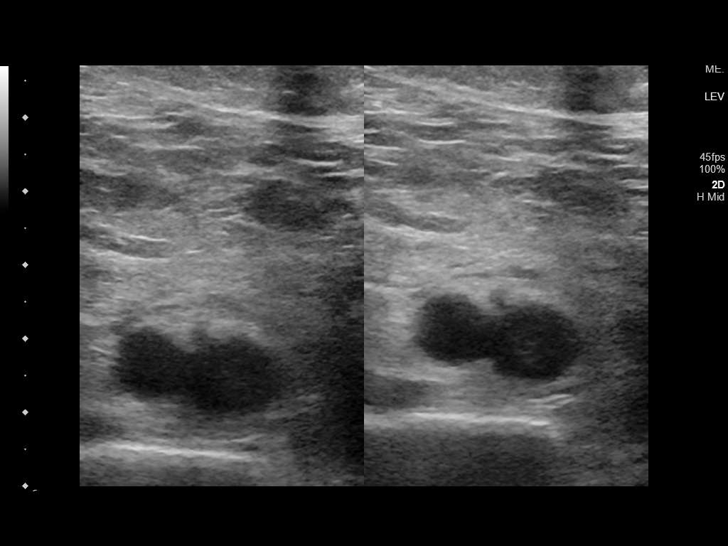
[im 27/33]
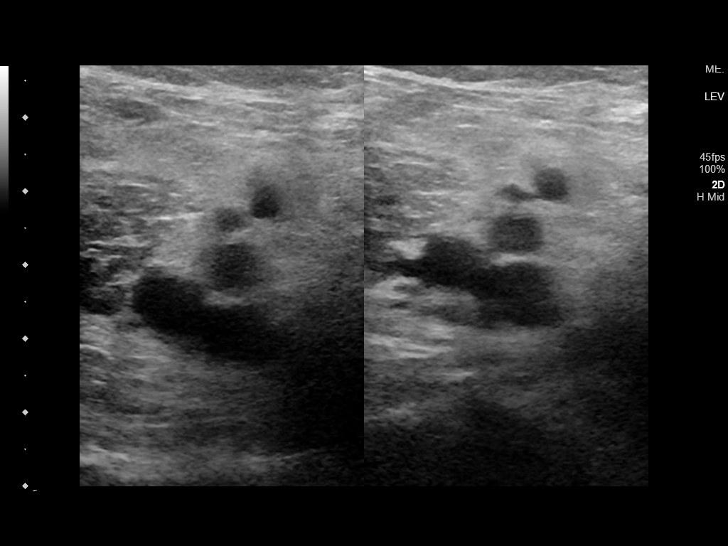
[im 30/33]
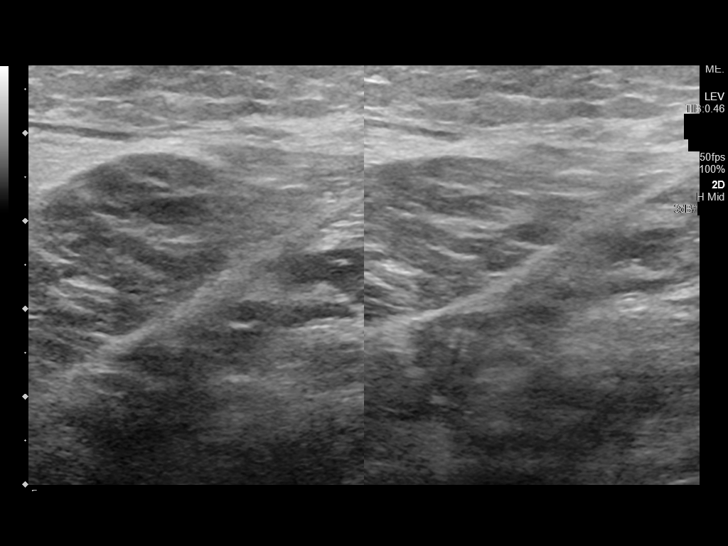
[im 33/33]
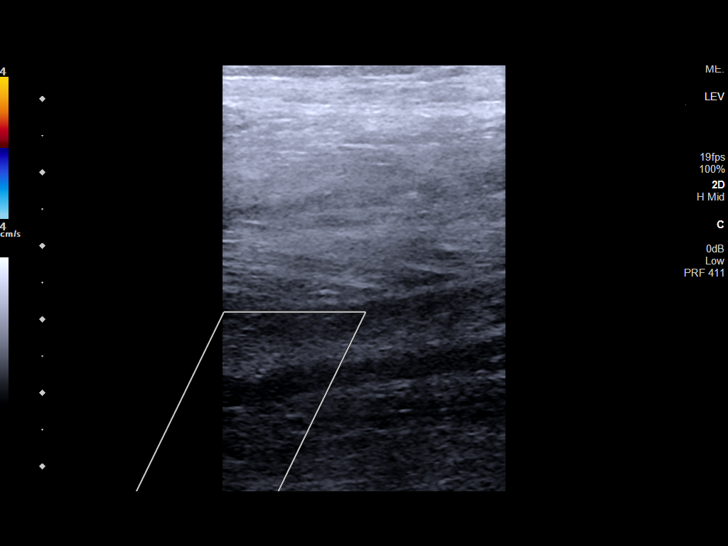

[13 of 24 positions shown; findings below may reference images not displayed]

FINDINGS: Contralateral Common Femoral Vein: Respiratory phasicity is normal
and symmetric with the symptomatic side. No evidence of thrombus.
Normal compressibility.

Common Femoral Vein: There is occlusive thrombus in the left common
femoral vein.

Saphenofemoral Junction: There is occlusive thrombus at the
saphenofemoral junction.

Profunda Femoral Vein: There is occlusive thrombus in the profunda
femoral vein.

Femoral Vein: There is occlusive thrombus in the femoral vein.

Popliteal Vein: There is occlusive thrombus in the popliteal vein.

Calf Veins: There is occlusive thrombus in the imaged calf veins.

Superficial Great Saphenous Vein: There is apparent Doppler flow
within the imaged portions of the great saphenous vein.

Venous Reflux:  None.

Other Findings:  None.
IMPRESSION: Extensive occlusive thrombus throughout the left lower extremity
from the calf to the groin.

These results were called by telephone at the time of interpretation
on 04/07/2021 at [DATE] to provider Toussaint Steiner, who verbally
acknowledged these results.

## 2023-11-27 ENCOUNTER — Encounter (INDEPENDENT_AMBULATORY_CARE_PROVIDER_SITE_OTHER): Payer: Self-pay
